# Patient Record
Sex: Male | Born: 2000 | Race: White | Hispanic: No | Marital: Single | Smoking: Former smoker
Health system: Southern US, Community
[De-identification: ages and names within clinical notes are randomized; demographics above are authoritative.]

## PROBLEM LIST (undated history)

## (undated) DIAGNOSIS — Z23 Encounter for immunization: Secondary | ICD-10-CM

## (undated) DIAGNOSIS — L03119 Cellulitis of unspecified part of limb: Secondary | ICD-10-CM

## (undated) DIAGNOSIS — I2699 Other pulmonary embolism without acute cor pulmonale: Secondary | ICD-10-CM

## (undated) DIAGNOSIS — S069XAA Unspecified intracranial injury with loss of consciousness status unknown, initial encounter: Secondary | ICD-10-CM

## (undated) DIAGNOSIS — Z931 Gastrostomy status: Secondary | ICD-10-CM

## (undated) DIAGNOSIS — S069X9A Unspecified intracranial injury with loss of consciousness of unspecified duration, initial encounter: Secondary | ICD-10-CM

## (undated) HISTORY — DX: Other pulmonary embolism without acute cor pulmonale: I26.99

## (undated) HISTORY — DX: Gastrostomy status: Z93.1

## (undated) HISTORY — DX: Unspecified intracranial injury with loss of consciousness of unspecified duration, initial encounter: S06.9X9A

## (undated) HISTORY — DX: Unspecified intracranial injury with loss of consciousness status unknown, initial encounter: S06.9XAA

---
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
## 2018-04-16 HISTORY — PX: OTHER SURGICAL HISTORY: SHX169

## 2018-04-21 DIAGNOSIS — S069XAA Unspecified intracranial injury with loss of consciousness status unknown, initial encounter: Secondary | ICD-10-CM | POA: Insufficient documentation

## 2018-04-21 DIAGNOSIS — Z9081 Acquired absence of spleen: Secondary | ICD-10-CM | POA: Insufficient documentation

## 2018-04-21 DIAGNOSIS — S3210XA Unspecified fracture of sacrum, initial encounter for closed fracture: Secondary | ICD-10-CM | POA: Insufficient documentation

## 2018-04-21 DIAGNOSIS — S12600A Unspecified displaced fracture of seventh cervical vertebra, initial encounter for closed fracture: Secondary | ICD-10-CM | POA: Insufficient documentation

## 2018-04-21 DIAGNOSIS — S42002A Fracture of unspecified part of left clavicle, initial encounter for closed fracture: Secondary | ICD-10-CM | POA: Insufficient documentation

## 2018-04-21 DIAGNOSIS — S32029A Unspecified fracture of second lumbar vertebra, initial encounter for closed fracture: Secondary | ICD-10-CM | POA: Insufficient documentation

## 2018-04-21 DIAGNOSIS — S42001A Fracture of unspecified part of right clavicle, initial encounter for closed fracture: Secondary | ICD-10-CM | POA: Insufficient documentation

## 2018-04-21 DIAGNOSIS — S2241XA Multiple fractures of ribs, right side, initial encounter for closed fracture: Secondary | ICD-10-CM | POA: Insufficient documentation

## 2018-04-21 DIAGNOSIS — S069X9A Unspecified intracranial injury with loss of consciousness of unspecified duration, initial encounter: Secondary | ICD-10-CM | POA: Insufficient documentation

## 2018-05-09 DIAGNOSIS — S062X9A Diffuse traumatic brain injury with loss of consciousness of unspecified duration, initial encounter: Secondary | ICD-10-CM | POA: Insufficient documentation

## 2018-05-09 DIAGNOSIS — S062XAA Diffuse traumatic brain injury with loss of consciousness status unknown, initial encounter: Secondary | ICD-10-CM | POA: Insufficient documentation

## 2018-05-21 DIAGNOSIS — I2699 Other pulmonary embolism without acute cor pulmonale: Secondary | ICD-10-CM

## 2018-05-21 HISTORY — DX: Other pulmonary embolism without acute cor pulmonale: I26.99

## 2018-06-22 DIAGNOSIS — S42125A Nondisplaced fracture of acromial process, left shoulder, initial encounter for closed fracture: Secondary | ICD-10-CM | POA: Insufficient documentation

## 2018-08-14 ENCOUNTER — Encounter: Payer: Self-pay | Admitting: Hematology

## 2018-08-14 LAB — PROTIME-INR

## 2018-08-16 ENCOUNTER — Emergency Department (HOSPITAL_COMMUNITY): Payer: BLUE CROSS/BLUE SHIELD

## 2018-08-16 ENCOUNTER — Other Ambulatory Visit: Payer: Self-pay

## 2018-08-16 ENCOUNTER — Encounter (HOSPITAL_COMMUNITY): Payer: Self-pay | Admitting: Internal Medicine

## 2018-08-16 ENCOUNTER — Inpatient Hospital Stay (HOSPITAL_COMMUNITY): Payer: BLUE CROSS/BLUE SHIELD | Attending: Internal Medicine | Admitting: Internal Medicine

## 2018-08-16 ENCOUNTER — Emergency Department (HOSPITAL_COMMUNITY)
Admission: EM | Admit: 2018-08-16 | Discharge: 2018-08-16 | Disposition: A | Discharge status: Home / Self Care | Payer: BLUE CROSS/BLUE SHIELD | Source: Home / Self Care | Attending: Emergency Medicine | Admitting: Emergency Medicine

## 2018-08-16 ENCOUNTER — Inpatient Hospital Stay (HOSPITAL_COMMUNITY): Payer: BLUE CROSS/BLUE SHIELD

## 2018-08-16 ENCOUNTER — Encounter (HOSPITAL_COMMUNITY): Payer: Self-pay

## 2018-08-16 VITALS — BP 121/85 | HR 80 | Resp 16

## 2018-08-16 DIAGNOSIS — I82722 Chronic embolism and thrombosis of deep veins of left upper extremity: Secondary | ICD-10-CM | POA: Diagnosis present

## 2018-08-16 DIAGNOSIS — D6851 Activated protein C resistance: Secondary | ICD-10-CM | POA: Diagnosis not present

## 2018-08-16 DIAGNOSIS — Z87891 Personal history of nicotine dependence: Secondary | ICD-10-CM | POA: Insufficient documentation

## 2018-08-16 DIAGNOSIS — Z79899 Other long term (current) drug therapy: Secondary | ICD-10-CM | POA: Diagnosis not present

## 2018-08-16 DIAGNOSIS — Z993 Dependence on wheelchair: Secondary | ICD-10-CM

## 2018-08-16 DIAGNOSIS — Z7901 Long term (current) use of anticoagulants: Secondary | ICD-10-CM

## 2018-08-16 DIAGNOSIS — Z931 Gastrostomy status: Secondary | ICD-10-CM

## 2018-08-16 DIAGNOSIS — Z87820 Personal history of traumatic brain injury: Secondary | ICD-10-CM | POA: Diagnosis not present

## 2018-08-16 DIAGNOSIS — Z86711 Personal history of pulmonary embolism: Secondary | ICD-10-CM | POA: Insufficient documentation

## 2018-08-16 DIAGNOSIS — K5900 Constipation, unspecified: Secondary | ICD-10-CM

## 2018-08-16 LAB — COMPREHENSIVE METABOLIC PANEL
ALT: 42 U/L (ref 0–44)
ALT: 36 U/L (ref 0–44)
AST: 17 U/L (ref 15–41)
AST: 16 U/L (ref 15–41)
Albumin: 5 g/dL (ref 3.5–5.0)
Albumin: 4.9 g/dL (ref 3.5–5.0)
Alkaline Phosphatase: 106 U/L (ref 38–126)
Alkaline Phosphatase: 103 U/L (ref 38–126)
Anion gap: 8 (ref 5–15)
Anion gap: 9 (ref 5–15)
BUN: 13 mg/dL (ref 6–20)
BUN: 14 mg/dL (ref 6–20)
CO2: 28 mmol/L (ref 22–32)
CO2: 28 mmol/L (ref 22–32)
Calcium: 10.5 mg/dL — ABNORMAL HIGH (ref 8.9–10.3)
Calcium: 10.1 mg/dL (ref 8.9–10.3)
Chloride: 104 mmol/L (ref 98–111)
Chloride: 104 mmol/L (ref 98–111)
Creatinine, Ser: 0.66 mg/dL (ref 0.61–1.24)
Creatinine, Ser: 0.63 mg/dL (ref 0.61–1.24)
GFR calc Af Amer: >60 mL/min (ref >60)
GFR calc Af Amer: >60 mL/min (ref >60)
GFR calc non Af Amer: >60 mL/min (ref >60)
GFR calc non Af Amer: >60 mL/min (ref >60)
Glucose, Bld: 90 mg/dL (ref 70–99)
Glucose, Bld: 69 mg/dL — ABNORMAL LOW (ref 70–99)
Potassium: 4 mmol/L (ref 3.5–5.1)
Potassium: 3.7 mmol/L (ref 3.5–5.1)
Sodium: 140 mmol/L (ref 135–145)
Sodium: 141 mmol/L (ref 135–145)
Total Bilirubin: 1 mg/dL (ref 0.3–1.2)
Total Bilirubin: 0.8 mg/dL (ref 0.3–1.2)
Total Protein: 8.7 g/dL — ABNORMAL HIGH (ref 6.5–8.1)
Total Protein: 8.3 g/dL — ABNORMAL HIGH (ref 6.5–8.1)

## 2018-08-16 LAB — CBC
HCT: 39.6 % (ref 39.0–52.0)
Hemoglobin: 12.9 g/dL — ABNORMAL LOW (ref 13.0–17.0)
MCH: 29.5 pg (ref 26.0–34.0)
MCHC: 32.6 g/dL (ref 30.0–36.0)
MCV: 90.6 fL (ref 80.0–100.0)
Platelets: 587 10*3/uL — ABNORMAL HIGH (ref 150–400)
RBC: 4.37 MIL/uL (ref 4.22–5.81)
RDW: 15 % (ref 11.5–15.5)
WBC: 8.8 10*3/uL (ref 4.0–10.5)
nRBC: 0 % (ref 0.0–0.2)

## 2018-08-16 LAB — CBC WITH DIFFERENTIAL/PLATELET
Abs Immature Granulocytes: 0.02 10*3/uL (ref 0.00–0.07)
Basophils Absolute: 0 10*3/uL (ref 0.0–0.1)
Basophils Relative: 0 %
Eosinophils Absolute: 0.2 10*3/uL (ref 0.0–0.5)
Eosinophils Relative: 2 %
HCT: 39.5 % (ref 39.0–52.0)
Hemoglobin: 12.7 g/dL — ABNORMAL LOW (ref 13.0–17.0)
Immature Granulocytes: 0 %
Lymphocytes Relative: 24 %
Lymphs Abs: 2.3 10*3/uL (ref 0.7–4.0)
MCH: 29.3 pg (ref 26.0–34.0)
MCHC: 32.2 g/dL (ref 30.0–36.0)
MCV: 91.2 fL (ref 80.0–100.0)
Monocytes Absolute: 0.7 10*3/uL (ref 0.1–1.0)
Monocytes Relative: 7 %
Neutro Abs: 6.4 10*3/uL (ref 1.7–7.7)
Neutrophils Relative %: 67 %
Platelets: 567 10*3/uL — ABNORMAL HIGH (ref 150–400)
RBC: 4.33 MIL/uL (ref 4.22–5.81)
RDW: 15 % (ref 11.5–15.5)
WBC: 9.6 10*3/uL (ref 4.0–10.5)
nRBC: 0 % (ref 0.0–0.2)

## 2018-08-16 LAB — LACTATE DEHYDROGENASE: LDH: 172 U/L (ref 98–192)

## 2018-08-16 LAB — PROTIME-INR
INR: 1.82
Prothrombin Time: 20.9 seconds — ABNORMAL HIGH (ref 11.4–15.2)

## 2018-08-16 LAB — FERRITIN: Ferritin: 98 ng/mL (ref 24–336)

## 2018-08-16 LAB — I-STAT CG4 LACTIC ACID, ED: Lactic Acid, Venous: 1.12 mmol/L (ref 0.5–1.9)

## 2018-08-16 MED ORDER — BISACODYL 10 MG RE SUPP: 10.0000 mg | RECTAL | 0 refills | Status: DC | PRN

## 2018-08-16 MED ORDER — POLYETHYLENE GLYCOL 3350 17 G PO PACK: 17.0000 g | PACK | Freq: Every day | ORAL | 3 refills | Status: DC

## 2018-08-16 NOTE — ED Triage Notes (Addendum)
Pt released from hospital last Tuesday. 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  Mother reports that he is not swallowing as well as he was when he got home from hospital. He reports his mouth hurts and it hurts to swallow . Had thrush in hospital. Mother is giving more through feeding tube to keep hydrated. Also, reports constipation for 6 days,. Last good BM one week ago. Mother reports a decline in energy
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
## 2018-08-16 NOTE — ED Notes (Signed)
ED Provider at bedside. 

## 2018-08-16 NOTE — Discharge Instructions (Addendum)
You were evaluated in the Emergency Department and after careful evaluation, we did not find any emergent condition requiring admission or further testing in the hospital.  Your symptoms today seem to be due to constipation.  MiraLAX can be used up to 6 times a day to help relieve this constipation.  Of also provided some Dulcolax suppositories if the MiraLAX does not seem to be strong enough.  A referral has been placed to the [facility] case management team.  They should be aware of this case and should call you within the next few days to help troubleshoot your home health issues.  If you would like to call this Friday, you can reach our case manager Shanda BumpsJessica at [phone].  Please return to the Emergency Department if you experience any worsening of your condition.  We encourage you to follow up with a primary care provider.  Thank you for allowing us to be a part of your care.

## 2018-08-16 NOTE — ED Provider Notes (Signed)
[facility] Emergency Department Provider Note MRN:  [id number]  Arrival date & time: 08/16/18     Chief Complaint   Dental Pain and Constipation   History of Present Illness   Charles Arroyo is a 17 y.o. year-old male with a [facility], splenectomy presenting to the ED with chief complaint of constipation.  Extensive 371-month [facility], [facility] and [facility].  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  Hospitalization was complicated by stroke as well as pulmonary embolism.  Recently discharged a few days ago.  Mom reporting he has not had a bowel movement in 6 days.  Today complaining of mouth and throat pain.  No recent fevers, no chest pain or shortness of breath, no abdominal pain.  Seems to be eating and drinking less for the past few days.  I was unable to obtain an accurate HPI, PMH, or ROS due to the patient's traumatic brain injury.  Review of Systems  A complete 10 system review of systems was obtained and all systems are negative except as noted in the HPI and PMH.   Patient's Health History    Past Medical History:  Diagnosis Date  . Brain injury (HCC)   . G tube feedings (HCC)   . PE (pulmonary thromboembolism) (HCC) 05/2018    Past Surgical History:  Procedure Laterality Date  . broken clavicles  04/16/2018  . broken jaw  04/16/2018  . broken right heel  04/16/2018    No family history on file.  Social History   Socioeconomic History  . Marital status: Single    Spouse name: Not on file  . Number of children: Not on file  . Years of education: Not on file  . Highest education level: Not on file  Occupational History  . Not on file  Social Needs  . Financial resource strain: Not on file  . Food insecurity:    Worry: Not on file    Inability: Not on file  . Transportation needs:    Medical: Not on file    Non-medical: Not on  file  Tobacco Use  . Smoking status: Former Smoker    Packs/day: 0.50    Years: 5.00    Pack years: 2.50    Types: Cigarettes    Last attempt to quit: 04/16/2018    Years since quitting: 0.3  . Smokeless tobacco: Former Engineer, waterUser  Substance and Sexual Activity  . Alcohol use: No  . Drug use: No  . Sexual activity: Not on file  Lifestyle  . Physical activity:    Days per week: Not on file    Minutes per session: Not on file  . Stress: Not on file  Relationships  . Social connections:    Talks on phone: Not on file    Gets together: Not on file    Attends religious service: Not on file    Active member of club or organization: Not on file    Attends meetings of clubs or organizations: Not on file    Relationship status: Not on file  . Intimate partner violence:    Fear of current or ex partner: Not on file    Emotionally abused: Not on file    Physically abused: Not on file    Forced sexual activity: Not on file  Other Topics Concern  . Not on file  Social History Narrative  . Not on file  Physical Exam  Vital Signs and Nursing Notes reviewed Vitals:   08/16/18 1741  BP: 120/81  Pulse: (!) 108  Resp: 20  Temp: 97.8 F (36.6 C)  SpO2: 99%    CONSTITUTIONAL: Chronically ill-appearing, NAD NEURO:  Alert and oriented x 3, no focal deficits EYES:  eyes equal and reactive ENT/NECK:  no LAD, no JVD; no evidence of thrush, no erythema to the posterior oropharynx CARDIO: Tachycardic rate, well-perfused, normal S1 and S2 PULM:  CTAB no wheezing or rhonchi GI/GU:  normal bowel sounds, non-distended, non-tender MSK/SPINE:  No gross deformities, no edema; contracture of left upper extremity, brace on right lower extremity SKIN:  no rash, atraumatic PSYCH:  Appropriate speech and behavior  Diagnostic and Interventional Summary    Labs Reviewed  CBC - Abnormal; Notable for the following components:      Result Value   Hemoglobin 12.9 (*)    Platelets 587 (*)    All  other components within normal limits  COMPREHENSIVE METABOLIC PANEL - Abnormal; Notable for the following components:   Glucose, Bld 69 (*)    Total Protein 8.3 (*)    All other components within normal limits  PROTIME-INR - Abnormal; Notable for the following components:   Prothrombin Time 20.9 (*)    All other components within normal limits  I-STAT CG4 LACTIC ACID, ED    DG Abd Acute W/Chest  Final Result      Medications - No data to display   Fecal disimpaction Date/Time: 08/16/2018 9:24 PM Performed by: Sabas Sous, MD Authorized by: Sabas Sous, MD  Consent: Verbal consent obtained. Risks and benefits: risks, benefits and alternatives were discussed Consent given by: parent Local anesthesia used: no  Anesthesia: Local anesthesia used: no  Sedation: Patient sedated: no  Patient tolerance: Patient tolerated the procedure well with no immediate complications Comments: Moderate density stool in the rectum, small amount removed and displaced from rectal wall.      Critical Care  ED Course and Medical Decision Making  I have reviewed the triage vital signs and the nursing notes.  Pertinent labs & imaging results that were available during my care of the patient were reviewed by me and considered in my medical decision making (see below for details).  Will screen for dehydration, AKI, metabolic disarray in this 17 year old male with recent extended hospitalization here with decreased p.o. intake.  Will evaluate for fecal impaction with rectal exam and disimpacted necessary given the recent constipation.  Will consult case management/social work given the concern for lack of home health resources.  Labs reassuring, plain film confirming suspicion of constipation, no other acute findings.  Long discussion with family regarding home health needs.  It does sound like there was a lapse in his healthcare needs, [facility] was going to set up home health but it seems to  have fallen through.  Family has tried extensively, has been denied by multiple home health providers.  No indication for admission at this time, referral made to case management to hopefully review the case and help them set up home health.  Family given number to our case managers as well, to call after the holiday.  After the discussed management above, the patient was determined to be safe for discharge.  The patient was in agreement with this plan and all questions regarding their care were answered.  ED return precautions were discussed and the patient will return to the ED with any significant worsening of condition.  Elmer Sow.  Pilar Plate, MD [facility] Emergency Medicine [facility] [email or web address]  Final Clinical Impressions(s) / ED Diagnoses     ICD-10-CM   1. Constipation K59.00 DG Abd Acute W/Chest    DG Abd Acute W/Chest    ED Discharge Orders         Ordered    polyethylene glycol (MIRALAX) packet  Daily     08/16/18 2120    bisacodyl (DULCOLAX) 10 MG suppository  As needed     08/16/18 2120             Sabas Sous, MD 08/16/18 2129

## 2018-08-16 NOTE — ED Notes (Signed)
Patient transported to X-ray 

## 2018-08-17 LAB — ANTINUCLEAR ANTIBODIES, IFA: ANA Ab, IFA: NEGATIVE

## 2018-08-17 LAB — LUPUS ANTICOAGULANT PANEL
DRVVT: 38.1 s (ref 0.0–47.0)
PTT Lupus Anticoagulant: 40.6 s (ref 0.0–51.9)

## 2018-08-17 NOTE — Care Management (Signed)
CM consult received by ED. Per note [facility] and referral made to CM to see if [facility] could [facility]. No HH ordered. CM will f/u on Monday with call to PCP office.

## 2018-08-20 LAB — BETA-2-GLYCOPROTEIN I ABS, IGG/M/A
Beta-2 Glyco I IgG: <9 GPI IgG units (ref 0–20)
Beta-2-Glycoprotein I IgA: 15 GPI IgA units (ref 0–25)
Beta-2-Glycoprotein I IgM: <9 GPI IgM units (ref 0–32)

## 2018-08-21 ENCOUNTER — Other Ambulatory Visit: Payer: Self-pay

## 2018-08-21 ENCOUNTER — Emergency Department (HOSPITAL_COMMUNITY)
Admission: EM | Admit: 2018-08-21 | Discharge: 2018-08-21 | Disposition: A | Discharge status: Home / Self Care | Payer: BLUE CROSS/BLUE SHIELD | Attending: Emergency Medicine | Admitting: Emergency Medicine

## 2018-08-21 ENCOUNTER — Encounter (HOSPITAL_COMMUNITY): Payer: Self-pay | Admitting: Emergency Medicine

## 2018-08-21 ENCOUNTER — Emergency Department (HOSPITAL_COMMUNITY): Payer: BLUE CROSS/BLUE SHIELD

## 2018-08-21 DIAGNOSIS — Z87891 Personal history of nicotine dependence: Secondary | ICD-10-CM | POA: Diagnosis not present

## 2018-08-21 DIAGNOSIS — K5900 Constipation, unspecified: Secondary | ICD-10-CM | POA: Insufficient documentation

## 2018-08-21 LAB — CBC
HCT: 39 % (ref 39.0–52.0)
Hemoglobin: 12.6 g/dL — ABNORMAL LOW (ref 13.0–17.0)
MCH: 29.4 pg (ref 26.0–34.0)
MCHC: 32.3 g/dL (ref 30.0–36.0)
MCV: 91.1 fL (ref 80.0–100.0)
Platelets: 483 10*3/uL — ABNORMAL HIGH (ref 150–400)
RBC: 4.28 MIL/uL (ref 4.22–5.81)
RDW: 15.1 % (ref 11.5–15.5)
WBC: 6.5 10*3/uL (ref 4.0–10.5)
nRBC: 0 % (ref 0.0–0.2)

## 2018-08-21 LAB — PROTIME-INR
INR: 2.17
Prothrombin Time: 23.9 seconds — ABNORMAL HIGH (ref 11.4–15.2)

## 2018-08-21 LAB — COMPREHENSIVE METABOLIC PANEL
ALT: 49 U/L — ABNORMAL HIGH (ref 0–44)
AST: 23 U/L (ref 15–41)
Albumin: 4.6 g/dL (ref 3.5–5.0)
Alkaline Phosphatase: 94 U/L (ref 38–126)
Anion gap: 10 (ref 5–15)
BUN: 12 mg/dL (ref 6–20)
CO2: 29 mmol/L (ref 22–32)
Calcium: 10 mg/dL (ref 8.9–10.3)
Chloride: 100 mmol/L (ref 98–111)
Creatinine, Ser: 0.56 mg/dL — ABNORMAL LOW (ref 0.61–1.24)
GFR calc Af Amer: >60 mL/min (ref >60)
GFR calc non Af Amer: >60 mL/min (ref >60)
Glucose, Bld: 96 mg/dL (ref 70–99)
Potassium: 4.1 mmol/L (ref 3.5–5.1)
Sodium: 139 mmol/L (ref 135–145)
Total Bilirubin: 0.7 mg/dL (ref 0.3–1.2)
Total Protein: 7.9 g/dL (ref 6.5–8.1)

## 2018-08-21 LAB — FACTOR 5 LEIDEN

## 2018-08-21 NOTE — Care Management (Signed)
[facility] has [facility] to determine if referral has previously been made. MD will need to order home health prior to DC from ED and CM will continue to follow.

## 2018-08-21 NOTE — ED Triage Notes (Signed)
Returns to ED, for constipation, Been using miralax as directed, only had one BM since then, Pt is non verbal. Mother states he is in pain, Will not allow them to put his braces on his leg.  Food is coming back out of peg tube. Pt does not have Home health.  Charles Arroyo for a consult.

## 2018-08-21 NOTE — Care Management (Signed)
CM contacted [facility] who says referrals were declined earlier in the year because patient could not be appropriately cared for in the home. CM attempted to contact patient and mother but were unsuccessful. [facility] and notified them about attempt to make referral.
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
## 2018-08-21 NOTE — ED Triage Notes (Signed)
Pt came home with [facility] Nov 18th, Call Thereasa DistanceRodney to consult with family for assistance.

## 2018-08-21 NOTE — Discharge Instructions (Signed)
Please call the [facility] internal medicine clinic to arrange a follow-up with a family doctor at a comprehensive Medical Center.  These administer MiraLAX 4 times a day, if this does not work go to 5 times a day, you may want to try an enema as well and continue to try suppositories.  I would encourage you to seek a consultation with a gastroenterologist, I have given you the phone number for the gastroenterologist on-call at this time.  Return to the emergency department for severe or worsening symptoms

## 2018-08-22 LAB — PROTHROMBIN GENE MUTATION
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
## 2018-08-28 ENCOUNTER — Encounter

## 2018-08-28 ENCOUNTER — Encounter (HOSPITAL_COMMUNITY): Payer: Self-pay | Admitting: Internal Medicine

## 2018-08-28 ENCOUNTER — Other Ambulatory Visit: Payer: Self-pay

## 2018-08-28 ENCOUNTER — Encounter (HOSPITAL_COMMUNITY): Payer: Self-pay

## 2018-08-28 ENCOUNTER — Ambulatory Visit (HOSPITAL_COMMUNITY): Payer: BLUE CROSS/BLUE SHIELD

## 2018-08-28 ENCOUNTER — Inpatient Hospital Stay (HOSPITAL_COMMUNITY): Payer: BLUE CROSS/BLUE SHIELD | Attending: Internal Medicine | Admitting: Internal Medicine

## 2018-08-28 VITALS — BP 115/88 | HR 103 | Resp 18

## 2018-08-28 DIAGNOSIS — Z7901 Long term (current) use of anticoagulants: Secondary | ICD-10-CM | POA: Diagnosis not present

## 2018-08-28 DIAGNOSIS — Z87820 Personal history of traumatic brain injury: Secondary | ICD-10-CM

## 2018-08-28 DIAGNOSIS — Z87891 Personal history of nicotine dependence: Secondary | ICD-10-CM | POA: Diagnosis not present

## 2018-08-28 DIAGNOSIS — Z86711 Personal history of pulmonary embolism: Secondary | ICD-10-CM | POA: Insufficient documentation

## 2018-08-28 DIAGNOSIS — R0602 Shortness of breath: Secondary | ICD-10-CM

## 2018-08-28 DIAGNOSIS — I82722 Chronic embolism and thrombosis of deep veins of left upper extremity: Secondary | ICD-10-CM | POA: Insufficient documentation

## 2018-08-28 DIAGNOSIS — Z931 Gastrostomy status: Secondary | ICD-10-CM | POA: Diagnosis not present

## 2018-08-28 DIAGNOSIS — Z79899 Other long term (current) drug therapy: Secondary | ICD-10-CM

## 2018-08-28 DIAGNOSIS — F17210 Nicotine dependence, cigarettes, uncomplicated: Secondary | ICD-10-CM | POA: Diagnosis not present

## 2018-08-28 DIAGNOSIS — Z720 Tobacco use: Secondary | ICD-10-CM
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
## 2018-09-05 ENCOUNTER — Emergency Department (HOSPITAL_COMMUNITY)
Admission: EM | Admit: 2018-09-05 | Discharge: 2018-09-05 | Disposition: A | Discharge status: Home / Self Care | Payer: BLUE CROSS/BLUE SHIELD | Attending: Emergency Medicine | Admitting: Emergency Medicine

## 2018-09-05 ENCOUNTER — Other Ambulatory Visit: Payer: Self-pay

## 2018-09-05 ENCOUNTER — Encounter (HOSPITAL_COMMUNITY): Payer: Self-pay | Admitting: Emergency Medicine

## 2018-09-05 DIAGNOSIS — Z7901 Long term (current) use of anticoagulants: Secondary | ICD-10-CM | POA: Insufficient documentation

## 2018-09-05 DIAGNOSIS — K5900 Constipation, unspecified: Secondary | ICD-10-CM | POA: Diagnosis not present

## 2018-09-05 DIAGNOSIS — Z7401 Bed confinement status: Secondary | ICD-10-CM | POA: Insufficient documentation

## 2018-09-05 DIAGNOSIS — Z87891 Personal history of nicotine dependence: Secondary | ICD-10-CM | POA: Insufficient documentation

## 2018-09-05 DIAGNOSIS — Z86711 Personal history of pulmonary embolism: Secondary | ICD-10-CM | POA: Insufficient documentation

## 2018-09-05 DIAGNOSIS — Z87820 Personal history of traumatic brain injury: Secondary | ICD-10-CM | POA: Diagnosis not present

## 2018-09-05 DIAGNOSIS — Z79899 Other long term (current) drug therapy: Secondary | ICD-10-CM | POA: Diagnosis not present
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
## 2018-09-05 NOTE — Discharge Instructions (Addendum)
Increase MiraLAX to 2 scoopful's a day with 40 ounces of fluid and increase to 2 capfuls a day until effect to max of 8 capfuls at one time.  Follow up with primary care doctor.
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
## 2018-09-07 ENCOUNTER — Encounter (HOSPITAL_COMMUNITY): Payer: Self-pay

## 2018-09-07 ENCOUNTER — Other Ambulatory Visit: Payer: Self-pay

## 2018-09-07 ENCOUNTER — Emergency Department (HOSPITAL_COMMUNITY)
Admission: EM | Admit: 2018-09-07 | Discharge: 2018-09-08 | Disposition: A | Discharge status: Home / Self Care | Payer: BLUE CROSS/BLUE SHIELD | Attending: Emergency Medicine | Admitting: Emergency Medicine

## 2018-09-07 DIAGNOSIS — Z79899 Other long term (current) drug therapy: Secondary | ICD-10-CM | POA: Insufficient documentation

## 2018-09-07 DIAGNOSIS — Y939 Activity, unspecified: Secondary | ICD-10-CM | POA: Diagnosis not present

## 2018-09-07 DIAGNOSIS — Z931 Gastrostomy status: Secondary | ICD-10-CM | POA: Diagnosis not present

## 2018-09-07 DIAGNOSIS — Z7901 Long term (current) use of anticoagulants: Secondary | ICD-10-CM

## 2018-09-07 DIAGNOSIS — Z87891 Personal history of nicotine dependence: Secondary | ICD-10-CM | POA: Diagnosis not present

## 2018-09-07 DIAGNOSIS — M7051 Other bursitis of knee, right knee: Secondary | ICD-10-CM | POA: Insufficient documentation

## 2018-09-07 DIAGNOSIS — Z86718 Personal history of other venous thrombosis and embolism: Secondary | ICD-10-CM | POA: Diagnosis not present

## 2018-09-07 DIAGNOSIS — R2241 Localized swelling, mass and lump, right lower limb: Secondary | ICD-10-CM | POA: Diagnosis present

## 2018-09-07 LAB — BASIC METABOLIC PANEL
Anion gap: 12 (ref 5–15)
BUN: 10 mg/dL (ref 6–20)
CO2: 27 mmol/L (ref 22–32)
Calcium: 9.8 mg/dL (ref 8.9–10.3)
Chloride: 101 mmol/L (ref 98–111)
Creatinine, Ser: 0.64 mg/dL (ref 0.61–1.24)
GFR calc Af Amer: >60 mL/min (ref >60)
GFR calc non Af Amer: >60 mL/min (ref >60)
Glucose, Bld: 84 mg/dL (ref 70–99)
Potassium: 3.9 mmol/L (ref 3.5–5.1)
Sodium: 140 mmol/L (ref 135–145)

## 2018-09-07 LAB — CBC
HCT: 41 % (ref 39.0–52.0)
Hemoglobin: 13.6 g/dL (ref 13.0–17.0)
MCH: 29.6 pg (ref 26.0–34.0)
MCHC: 33.2 g/dL (ref 30.0–36.0)
MCV: 89.3 fL (ref 80.0–100.0)
Platelets: 439 10*3/uL — ABNORMAL HIGH (ref 150–400)
RBC: 4.59 MIL/uL (ref 4.22–5.81)
RDW: 14.3 % (ref 11.5–15.5)
WBC: 7.9 10*3/uL (ref 4.0–10.5)
nRBC: 0 % (ref 0.0–0.2)

## 2018-09-07 MED ORDER — PREDNISONE 20 MG PO TABS
60.0000 mg | ORAL_TABLET | Freq: Once | ORAL | Status: AC
  Administered 2018-09-07: 60 mg via ORAL
  Filled 2018-09-07: qty 3

## 2018-09-07 MED ORDER — PREDNISONE 50 MG PO TABS: 50.0000 mg | ORAL_TABLET | Freq: Every day | ORAL | 0 refills | Status: DC
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
## 2018-09-07 NOTE — Discharge Instructions (Addendum)
Apply ice for 20-30 minutes, 3-4 times a day.  Return if swelling is getting worse, or if he starts running a fever.

## 2018-11-08 DIAGNOSIS — Z87820 Personal history of traumatic brain injury: Secondary | ICD-10-CM | POA: Insufficient documentation

## 2018-11-13 ENCOUNTER — Ambulatory Visit (HOSPITAL_COMMUNITY): Admission: RE | Admit: 2018-11-13 | Payer: BLUE CROSS/BLUE SHIELD | Source: Ambulatory Visit

## 2018-11-13 ENCOUNTER — Ambulatory Visit (HOSPITAL_COMMUNITY)
Admission: RE | Admit: 2018-11-13 | Discharge: 2018-11-13 | Disposition: A | Discharge status: Home / Self Care | Payer: BLUE CROSS/BLUE SHIELD | Source: Ambulatory Visit | Attending: Internal Medicine | Admitting: Internal Medicine

## 2018-11-13 ENCOUNTER — Inpatient Hospital Stay (HOSPITAL_COMMUNITY): Payer: BLUE CROSS/BLUE SHIELD | Attending: Hematology

## 2018-11-13 DIAGNOSIS — I82722 Chronic embolism and thrombosis of deep veins of left upper extremity: Secondary | ICD-10-CM

## 2018-11-13 DIAGNOSIS — F17210 Nicotine dependence, cigarettes, uncomplicated: Secondary | ICD-10-CM | POA: Diagnosis not present

## 2018-11-13 DIAGNOSIS — R0602 Shortness of breath: Secondary | ICD-10-CM

## 2018-11-13 DIAGNOSIS — Z720 Tobacco use: Secondary | ICD-10-CM

## 2018-11-13 LAB — CBC WITH DIFFERENTIAL/PLATELET
Abs Immature Granulocytes: 0.02 10*3/uL (ref 0.00–0.07)
Basophils Absolute: 0 10*3/uL (ref 0.0–0.1)
Basophils Relative: 0 %
Eosinophils Absolute: 0.2 10*3/uL (ref 0.0–0.5)
Eosinophils Relative: 2 %
HCT: 44.5 % (ref 39.0–52.0)
Hemoglobin: 14 g/dL (ref 13.0–17.0)
Immature Granulocytes: 0 %
Lymphocytes Relative: 26 %
Lymphs Abs: 2 10*3/uL (ref 0.7–4.0)
MCH: 30.4 pg (ref 26.0–34.0)
MCHC: 31.5 g/dL (ref 30.0–36.0)
MCV: 96.5 fL (ref 80.0–100.0)
Monocytes Absolute: 0.8 10*3/uL (ref 0.1–1.0)
Monocytes Relative: 10 %
Neutro Abs: 4.8 10*3/uL (ref 1.7–7.7)
Neutrophils Relative %: 62 %
Platelets: 449 10*3/uL — ABNORMAL HIGH (ref 150–400)
RBC: 4.61 MIL/uL (ref 4.22–5.81)
RDW: 13.8 % (ref 11.5–15.5)
WBC: 7.8 10*3/uL (ref 4.0–10.5)
nRBC: 0 % (ref 0.0–0.2)

## 2018-11-13 LAB — COMPREHENSIVE METABOLIC PANEL
ALT: 22 U/L (ref 0–44)
AST: 16 U/L (ref 15–41)
Albumin: 4.7 g/dL (ref 3.5–5.0)
Alkaline Phosphatase: 85 U/L (ref 38–126)
Anion gap: 8 (ref 5–15)
BUN: 14 mg/dL (ref 6–20)
CO2: 29 mmol/L (ref 22–32)
Calcium: 10 mg/dL (ref 8.9–10.3)
Chloride: 103 mmol/L (ref 98–111)
Creatinine, Ser: 0.57 mg/dL — ABNORMAL LOW (ref 0.61–1.24)
GFR calc Af Amer: >60 mL/min (ref >60)
GFR calc non Af Amer: >60 mL/min (ref >60)
Glucose, Bld: 88 mg/dL (ref 70–99)
Potassium: 4.1 mmol/L (ref 3.5–5.1)
Sodium: 140 mmol/L (ref 135–145)
Total Bilirubin: 0.4 mg/dL (ref 0.3–1.2)
Total Protein: 7.9 g/dL (ref 6.5–8.1)

## 2018-11-13 LAB — PROTIME-INR
INR: 2
Prothrombin Time: 22.2 seconds — ABNORMAL HIGH (ref 11.4–15.2)

## 2018-11-13 LAB — LACTATE DEHYDROGENASE: LDH: 142 U/L (ref 98–192)

## 2018-11-14 ENCOUNTER — Other Ambulatory Visit (HOSPITAL_COMMUNITY): Payer: BLUE CROSS/BLUE SHIELD

## 2018-11-15 LAB — BETA-2-GLYCOPROTEIN I ABS, IGG/M/A
Beta-2 Glyco I IgG: <9 GPI IgG units (ref 0–20)
Beta-2-Glycoprotein I IgA: <9 GPI IgA units (ref 0–25)
Beta-2-Glycoprotein I IgM: <9 GPI IgM units (ref 0–32)

## 2018-11-21 ENCOUNTER — Ambulatory Visit (HOSPITAL_COMMUNITY): Payer: BLUE CROSS/BLUE SHIELD | Admitting: Internal Medicine

## 2018-11-29 ENCOUNTER — Other Ambulatory Visit: Payer: Self-pay

## 2018-11-29 ENCOUNTER — Inpatient Hospital Stay (HOSPITAL_COMMUNITY): Payer: BLUE CROSS/BLUE SHIELD | Attending: Internal Medicine | Admitting: Internal Medicine

## 2018-11-29 ENCOUNTER — Encounter (HOSPITAL_COMMUNITY): Payer: Self-pay | Admitting: Internal Medicine

## 2018-11-29 VITALS — BP 114/74 | HR 78 | Temp 98.5°F | Resp 18

## 2018-11-29 DIAGNOSIS — Z7901 Long term (current) use of anticoagulants: Secondary | ICD-10-CM

## 2018-11-29 DIAGNOSIS — Z79899 Other long term (current) drug therapy: Secondary | ICD-10-CM

## 2018-11-29 DIAGNOSIS — Z87820 Personal history of traumatic brain injury: Secondary | ICD-10-CM | POA: Diagnosis not present

## 2018-11-29 DIAGNOSIS — I82722 Chronic embolism and thrombosis of deep veins of left upper extremity: Secondary | ICD-10-CM

## 2018-11-29 DIAGNOSIS — F17210 Nicotine dependence, cigarettes, uncomplicated: Secondary | ICD-10-CM | POA: Diagnosis not present

## 2018-11-29 DIAGNOSIS — Z993 Dependence on wheelchair: Secondary | ICD-10-CM

## 2018-11-29 DIAGNOSIS — Z931 Gastrostomy status: Secondary | ICD-10-CM | POA: Diagnosis not present
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
## 2018-11-29 NOTE — Patient Instructions (Signed)
Exam and discussion today with Dr. Melton Alar. Return to clinic as scheduled for follow-up.

## 2018-12-12 DIAGNOSIS — S069X9S Unspecified intracranial injury with loss of consciousness of unspecified duration, sequela: Secondary | ICD-10-CM | POA: Insufficient documentation

## 2018-12-12 DIAGNOSIS — F0630 Mood disorder due to known physiological condition, unspecified: Secondary | ICD-10-CM | POA: Insufficient documentation

## 2018-12-12 DIAGNOSIS — S069XAS Unspecified intracranial injury with loss of consciousness status unknown, sequela: Secondary | ICD-10-CM | POA: Insufficient documentation

## 2019-01-16 ENCOUNTER — Encounter: Payer: Self-pay | Admitting: Psychology

## 2019-02-26 ENCOUNTER — Encounter: Payer: Self-pay | Admitting: Student in an Organized Health Care Education/Training Program

## 2019-02-26 NOTE — Progress Notes (Signed)
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 Derek Jack, MD AP-ACAPA None  04/19/2019  9:00 AM Edgardo Roys, PsyD CPR-PRMA CPR    Total duration of non-face-to-face encounter: 19mnutes.  Primary Care Physician[facility] Note by: BGillis Santa MD Date: 02/27/2019; Time: 11:04 AM  Note: This dictation was prepared with Dragon dictation. Any transcriptional errors that may result from this process are unintentional.
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
## 2019-02-27 ENCOUNTER — Encounter: Payer: Self-pay | Admitting: Student in an Organized Health Care Education/Training Program

## 2019-02-27 ENCOUNTER — Other Ambulatory Visit: Payer: Self-pay

## 2019-02-27 ENCOUNTER — Ambulatory Visit
Payer: BLUE CROSS/BLUE SHIELD | Attending: Student in an Organized Health Care Education/Training Program | Admitting: Student in an Organized Health Care Education/Training Program

## 2019-02-27 DIAGNOSIS — G894 Chronic pain syndrome: Secondary | ICD-10-CM | POA: Insufficient documentation

## 2019-02-27 DIAGNOSIS — G890 Central pain syndrome: Secondary | ICD-10-CM | POA: Diagnosis not present

## 2019-02-27 DIAGNOSIS — S2241XS Multiple fractures of ribs, right side, sequela: Secondary | ICD-10-CM

## 2019-02-27 DIAGNOSIS — R252 Cramp and spasm: Secondary | ICD-10-CM

## 2019-02-27 DIAGNOSIS — S069X9S Unspecified intracranial injury with loss of consciousness of unspecified duration, sequela: Secondary | ICD-10-CM

## 2019-02-27 DIAGNOSIS — S062X9S Diffuse traumatic brain injury with loss of consciousness of unspecified duration, sequela: Secondary | ICD-10-CM

## 2019-02-27 DIAGNOSIS — S3210XS Unspecified fracture of sacrum, sequela: Secondary | ICD-10-CM

## 2019-02-27 DIAGNOSIS — S12601S Unspecified nondisplaced fracture of seventh cervical vertebra, sequela: Secondary | ICD-10-CM

## 2019-02-27 DIAGNOSIS — M24522 Contracture, left elbow: Secondary | ICD-10-CM | POA: Insufficient documentation

## 2019-02-27 DIAGNOSIS — S06369S Traumatic hemorrhage of cerebrum, unspecified, with loss of consciousness of unspecified duration, sequela: Secondary | ICD-10-CM

## 2019-02-27 DIAGNOSIS — Z87820 Personal history of traumatic brain injury: Secondary | ICD-10-CM

## 2019-02-27 DIAGNOSIS — S32029S Unspecified fracture of second lumbar vertebra, sequela: Secondary | ICD-10-CM

## 2019-02-27 MED ORDER — BACLOFEN 10 MG PO TABS: 10.0000 mg | ORAL_TABLET | Freq: Three times a day (TID) | ORAL | 2 refills | Status: DC

## 2019-02-27 MED ORDER — PREGABALIN 100 MG PO CAPS: 100.0000 mg | ORAL_CAPSULE | Freq: Two times a day (BID) | ORAL | 2 refills | Status: DC

## 2019-02-28 ENCOUNTER — Other Ambulatory Visit
Admission: RE | Admit: 2019-02-28 | Discharge: 2019-02-28 | Disposition: A | Discharge status: Home / Self Care | Payer: BLUE CROSS/BLUE SHIELD | Source: Ambulatory Visit | Attending: Student in an Organized Health Care Education/Training Program | Admitting: Student in an Organized Health Care Education/Training Program

## 2019-02-28 DIAGNOSIS — G894 Chronic pain syndrome: Secondary | ICD-10-CM | POA: Diagnosis present

## 2019-02-28 LAB — URINE DRUG SCREEN, QUALITATIVE (ARMC ONLY)
Amphetamines, Ur Screen: NOT DETECTED
Barbiturates, Ur Screen: NOT DETECTED
Benzodiazepine, Ur Scrn: NOT DETECTED
Cannabinoid 50 Ng, Ur ~~LOC~~: NOT DETECTED
Cocaine Metabolite,Ur ~~LOC~~: NOT DETECTED
MDMA (Ecstasy)Ur Screen: NOT DETECTED
Methadone Scn, Ur: NOT DETECTED
Opiate, Ur Screen: NOT DETECTED
Phencyclidine (PCP) Ur S: NOT DETECTED
Tricyclic, Ur Screen: NOT DETECTED

## 2019-03-08 ENCOUNTER — Telehealth: Payer: Self-pay | Admitting: *Deleted

## 2019-03-08 NOTE — Telephone Encounter (Signed)
Do you have any further suggestions?

## 2019-03-12 ENCOUNTER — Telehealth: Payer: Self-pay | Admitting: Student in an Organized Health Care Education/Training Program

## 2019-03-12 MED ORDER — CYCLOBENZAPRINE HCL 5 MG PO TABS: 5.0000 mg | ORAL_TABLET | Freq: Three times a day (TID) | ORAL | 0 refills | Status: DC | PRN

## 2019-03-12 NOTE — Telephone Encounter (Signed)
Given that patient is having difficulty managing his pain after transitioning him from tizanidine to baclofen can consider Flexeril 5 mg 3 times daily as needed.  Recommend patient discontinue baclofen, no need to wean off since he has only been on it for a short period of time.  Can try Flexeril.  If this is not effective, can re-prescribe tizanidine.

## 2019-03-12 NOTE — Telephone Encounter (Signed)
Spoke with Case manager Eve and she talked with Charles Arroyo's mom and she reports that since switching muscle relaxer that Charles Arroyo is having increased pain to the point that he is unable to participate in PT and does not have much "quality of life".  The mom also reported that she thought he had been on Baclofen before while in facility and they had to take him off then as well.  Wondering if something different can be prescribed.  I did tell Eve that I would send this message to Dr Holley Raring, he may require another phone visit with the mom but we would see if he can handle this over the phone.

## 2019-03-12 NOTE — Telephone Encounter (Signed)
Eve who is a case Freight forwarder contracted through the pts insurance called and wanted to speak with a nurse about the pts pain management. She stated ever since the muscle relaxer was changed that pts pain has been much worse.

## 2019-03-13 ENCOUNTER — Telehealth: Payer: Self-pay | Admitting: *Deleted

## 2019-03-13 NOTE — Telephone Encounter (Signed)
Voicemail left with Theresa Duty that Rufus has been escribed to [facility] in [place].  Discontinue Baclofen and start Flexeril tid.  If this is not beneficial Dr Holley Raring will rethink represcribing tizanidine.

## 2019-03-13 NOTE — Telephone Encounter (Signed)
Message left informing Eve of Dr. Elwyn Lade recommendation.

## 2019-04-06 ENCOUNTER — Other Ambulatory Visit (HOSPITAL_COMMUNITY): Payer: Self-pay | Admitting: *Deleted

## 2019-04-06 DIAGNOSIS — I82722 Chronic embolism and thrombosis of deep veins of left upper extremity: Secondary | ICD-10-CM

## 2019-04-09 ENCOUNTER — Inpatient Hospital Stay (HOSPITAL_COMMUNITY): Payer: BC Managed Care – PPO | Attending: Hematology

## 2019-04-09 ENCOUNTER — Other Ambulatory Visit: Payer: Self-pay

## 2019-04-09 DIAGNOSIS — Z7901 Long term (current) use of anticoagulants: Secondary | ICD-10-CM | POA: Insufficient documentation

## 2019-04-09 DIAGNOSIS — I82722 Chronic embolism and thrombosis of deep veins of left upper extremity: Secondary | ICD-10-CM | POA: Diagnosis not present

## 2019-04-09 LAB — CBC WITH DIFFERENTIAL/PLATELET
Abs Immature Granulocytes: 0.01 10*3/uL (ref 0.00–0.07)
Basophils Absolute: 0 10*3/uL (ref 0.0–0.1)
Basophils Relative: 0 %
Eosinophils Absolute: 0.2 10*3/uL (ref 0.0–0.5)
Eosinophils Relative: 3 %
HCT: 46.8 % (ref 39.0–52.0)
Hemoglobin: 15.5 g/dL (ref 13.0–17.0)
Immature Granulocytes: 0 %
Lymphocytes Relative: 31 %
Lymphs Abs: 2.2 10*3/uL (ref 0.7–4.0)
MCH: 31.3 pg (ref 26.0–34.0)
MCHC: 33.1 g/dL (ref 30.0–36.0)
MCV: 94.5 fL (ref 80.0–100.0)
Monocytes Absolute: 0.8 10*3/uL (ref 0.1–1.0)
Monocytes Relative: 12 %
Neutro Abs: 3.8 10*3/uL (ref 1.7–7.7)
Neutrophils Relative %: 54 %
Platelets: 405 10*3/uL — ABNORMAL HIGH (ref 150–400)
RBC: 4.95 MIL/uL (ref 4.22–5.81)
RDW: 13.8 % (ref 11.5–15.5)
WBC: 7.1 10*3/uL (ref 4.0–10.5)
nRBC: 0 % (ref 0.0–0.2)

## 2019-04-09 LAB — COMPREHENSIVE METABOLIC PANEL
ALT: 35 U/L (ref 0–44)
AST: 18 U/L (ref 15–41)
Albumin: 4.6 g/dL (ref 3.5–5.0)
Alkaline Phosphatase: 105 U/L (ref 38–126)
Anion gap: 9 (ref 5–15)
BUN: 13 mg/dL (ref 6–20)
CO2: 29 mmol/L (ref 22–32)
Calcium: 10 mg/dL (ref 8.9–10.3)
Chloride: 100 mmol/L (ref 98–111)
Creatinine, Ser: 0.57 mg/dL — ABNORMAL LOW (ref 0.61–1.24)
GFR calc Af Amer: >60 mL/min (ref >60)
GFR calc non Af Amer: >60 mL/min (ref >60)
Glucose, Bld: 84 mg/dL (ref 70–99)
Potassium: 3.9 mmol/L (ref 3.5–5.1)
Sodium: 138 mmol/L (ref 135–145)
Total Bilirubin: 0.8 mg/dL (ref 0.3–1.2)
Total Protein: 7.9 g/dL (ref 6.5–8.1)

## 2019-04-09 LAB — PROTIME-INR
INR: 1.1 (ref 0.8–1.2)
Prothrombin Time: 13.6 seconds (ref 11.4–15.2)

## 2019-04-09 LAB — LACTATE DEHYDROGENASE: LDH: 120 U/L (ref 98–192)

## 2019-04-16 ENCOUNTER — Ambulatory Visit (HOSPITAL_COMMUNITY): Payer: BLUE CROSS/BLUE SHIELD | Admitting: Hematology

## 2019-04-19 ENCOUNTER — Other Ambulatory Visit: Payer: Self-pay | Admitting: Student in an Organized Health Care Education/Training Program

## 2019-04-19 ENCOUNTER — Telehealth: Payer: Self-pay

## 2019-04-19 ENCOUNTER — Other Ambulatory Visit: Payer: Self-pay

## 2019-04-19 ENCOUNTER — Encounter: Payer: Self-pay | Admitting: Psychology

## 2019-04-19 ENCOUNTER — Encounter: Payer: BC Managed Care – PPO | Attending: Psychology | Admitting: Psychology

## 2019-04-19 DIAGNOSIS — F0630 Mood disorder due to known physiological condition, unspecified: Secondary | ICD-10-CM | POA: Diagnosis not present

## 2019-04-19 DIAGNOSIS — S069X9S Unspecified intracranial injury with loss of consciousness of unspecified duration, sequela: Secondary | ICD-10-CM | POA: Insufficient documentation

## 2019-04-19 DIAGNOSIS — Z87820 Personal history of traumatic brain injury: Secondary | ICD-10-CM | POA: Diagnosis not present

## 2019-04-19 DIAGNOSIS — S069X4D Unspecified intracranial injury with loss of consciousness of 6 hours to 24 hours, subsequent encounter: Secondary | ICD-10-CM | POA: Diagnosis not present

## 2019-04-19 MED ORDER — CYCLOBENZAPRINE HCL 10 MG PO TABS: 10.0000 mg | ORAL_TABLET | Freq: Three times a day (TID) | ORAL | 2 refills | Status: DC | PRN

## 2019-04-19 NOTE — Progress Notes (Signed)
Requested Prescriptions   Signed Prescriptions Disp Refills  . cyclobenzaprine (FLEXERIL) 10 MG tablet 90 tablet 2    Sig: Take 1 tablet (10 mg total) by mouth 3 (three) times daily as needed for muscle spasms.

## 2019-04-19 NOTE — Telephone Encounter (Signed)
Spoke with Dr Holley Raring.  He will resend a script for Flexeril 10 mg.  Patients mom notified.

## 2019-04-19 NOTE — Telephone Encounter (Signed)
He is out of muscle relaxers and doesn't have an appt till Tuesday. She wants to know if they can be called in today.
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
## 2019-04-23 ENCOUNTER — Encounter: Payer: Self-pay | Admitting: Student in an Organized Health Care Education/Training Program

## 2019-04-24 ENCOUNTER — Other Ambulatory Visit: Payer: Self-pay

## 2019-04-24 ENCOUNTER — Ambulatory Visit
Payer: BC Managed Care – PPO | Attending: Student in an Organized Health Care Education/Training Program | Admitting: Student in an Organized Health Care Education/Training Program

## 2019-04-24 DIAGNOSIS — S062X9S Diffuse traumatic brain injury with loss of consciousness of unspecified duration, sequela: Secondary | ICD-10-CM

## 2019-04-24 DIAGNOSIS — S069X9S Unspecified intracranial injury with loss of consciousness of unspecified duration, sequela: Secondary | ICD-10-CM

## 2019-04-24 DIAGNOSIS — S2241XS Multiple fractures of ribs, right side, sequela: Secondary | ICD-10-CM

## 2019-04-24 DIAGNOSIS — M24522 Contracture, left elbow: Secondary | ICD-10-CM

## 2019-04-24 DIAGNOSIS — R252 Cramp and spasm: Secondary | ICD-10-CM

## 2019-04-24 DIAGNOSIS — Z87820 Personal history of traumatic brain injury: Secondary | ICD-10-CM

## 2019-04-24 DIAGNOSIS — S06369S Traumatic hemorrhage of cerebrum, unspecified, with loss of consciousness of unspecified duration, sequela: Secondary | ICD-10-CM

## 2019-04-24 DIAGNOSIS — G890 Central pain syndrome: Secondary | ICD-10-CM

## 2019-04-24 DIAGNOSIS — G894 Chronic pain syndrome: Secondary | ICD-10-CM

## 2019-04-24 MED ORDER — CYCLOBENZAPRINE HCL 10 MG PO TABS: 10.0000 mg | ORAL_TABLET | Freq: Three times a day (TID) | ORAL | 3 refills | Status: DC | PRN

## 2019-04-24 MED ORDER — PREGABALIN 100 MG PO CAPS: 100.0000 mg | ORAL_CAPSULE | Freq: Two times a day (BID) | ORAL | 3 refills | Status: DC
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
## 2019-06-18 ENCOUNTER — Encounter: Payer: Self-pay | Admitting: Student in an Organized Health Care Education/Training Program

## 2019-06-18 ENCOUNTER — Ambulatory Visit
Payer: BC Managed Care – PPO | Attending: Student in an Organized Health Care Education/Training Program | Admitting: Student in an Organized Health Care Education/Training Program

## 2019-06-18 ENCOUNTER — Other Ambulatory Visit: Payer: Self-pay

## 2019-06-18 VITALS — BP 120/85 | HR 98 | Temp 97.7°F | Resp 16 | Ht 71.0 in | Wt 132.0 lb

## 2019-06-18 DIAGNOSIS — Z87820 Personal history of traumatic brain injury: Secondary | ICD-10-CM

## 2019-06-18 DIAGNOSIS — G890 Central pain syndrome: Secondary | ICD-10-CM | POA: Diagnosis present

## 2019-06-18 DIAGNOSIS — K5909 Other constipation: Secondary | ICD-10-CM | POA: Diagnosis present

## 2019-06-18 DIAGNOSIS — R252 Cramp and spasm: Secondary | ICD-10-CM | POA: Diagnosis not present

## 2019-06-18 DIAGNOSIS — S062X9S Diffuse traumatic brain injury with loss of consciousness of unspecified duration, sequela: Secondary | ICD-10-CM

## 2019-06-18 DIAGNOSIS — S06369S Traumatic hemorrhage of cerebrum, unspecified, with loss of consciousness of unspecified duration, sequela: Secondary | ICD-10-CM

## 2019-06-18 DIAGNOSIS — G894 Chronic pain syndrome: Secondary | ICD-10-CM | POA: Diagnosis not present

## 2019-06-18 DIAGNOSIS — S069X9S Unspecified intracranial injury with loss of consciousness of unspecified duration, sequela: Secondary | ICD-10-CM

## 2019-06-18 MED ORDER — AMITRIPTYLINE HCL 25 MG PO TABS: ORAL_TABLET | ORAL | 0 refills | Status: DC
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
## 2019-06-18 NOTE — Progress Notes (Signed)
Safety precautions to be maintained throughout the outpatient stay will include: orient to surroundings, keep bed in low position, maintain call bell within reach at all times, provide assistance with transfer out of bed and ambulation.  

## 2019-06-28 ENCOUNTER — Encounter: Payer: Self-pay | Admitting: Psychology

## 2019-06-28 ENCOUNTER — Encounter: Payer: BC Managed Care – PPO | Attending: Psychology | Admitting: Psychology

## 2019-06-28 ENCOUNTER — Other Ambulatory Visit: Payer: Self-pay

## 2019-06-28 DIAGNOSIS — F0630 Mood disorder due to known physiological condition, unspecified: Secondary | ICD-10-CM | POA: Diagnosis not present

## 2019-06-28 DIAGNOSIS — G894 Chronic pain syndrome: Secondary | ICD-10-CM | POA: Diagnosis not present

## 2019-06-28 DIAGNOSIS — S069X4D Unspecified intracranial injury with loss of consciousness of 6 hours to 24 hours, subsequent encounter: Secondary | ICD-10-CM | POA: Diagnosis present

## 2019-06-28 DIAGNOSIS — S069X9S Unspecified intracranial injury with loss of consciousness of unspecified duration, sequela: Secondary | ICD-10-CM | POA: Diagnosis not present
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
## 2019-07-13 ENCOUNTER — Emergency Department
Admission: EM | Admit: 2019-07-13 | Discharge: 2019-07-13 | Disposition: A | Discharge status: Home / Self Care | Payer: BC Managed Care – PPO | Attending: Emergency Medicine | Admitting: Emergency Medicine

## 2019-07-13 ENCOUNTER — Other Ambulatory Visit: Payer: Self-pay

## 2019-07-13 ENCOUNTER — Emergency Department: Payer: BC Managed Care – PPO

## 2019-07-13 ENCOUNTER — Encounter: Payer: Self-pay | Admitting: Emergency Medicine

## 2019-07-13 DIAGNOSIS — Z931 Gastrostomy status: Secondary | ICD-10-CM | POA: Insufficient documentation

## 2019-07-13 DIAGNOSIS — Z87891 Personal history of nicotine dependence: Secondary | ICD-10-CM | POA: Insufficient documentation

## 2019-07-13 DIAGNOSIS — Z7901 Long term (current) use of anticoagulants: Secondary | ICD-10-CM | POA: Insufficient documentation

## 2019-07-13 DIAGNOSIS — Z79899 Other long term (current) drug therapy: Secondary | ICD-10-CM | POA: Insufficient documentation

## 2019-07-13 DIAGNOSIS — B379 Candidiasis, unspecified: Secondary | ICD-10-CM

## 2019-07-13 DIAGNOSIS — R451 Restlessness and agitation: Secondary | ICD-10-CM | POA: Diagnosis not present

## 2019-07-13 DIAGNOSIS — Z86711 Personal history of pulmonary embolism: Secondary | ICD-10-CM | POA: Insufficient documentation

## 2019-07-13 DIAGNOSIS — R4182 Altered mental status, unspecified: Secondary | ICD-10-CM | POA: Diagnosis present

## 2019-07-13 LAB — URINALYSIS, COMPLETE (UACMP) WITH MICROSCOPIC
Bilirubin Urine: NEGATIVE
Glucose, UA: NEGATIVE mg/dL
Hgb urine dipstick: NEGATIVE
Ketones, ur: NEGATIVE mg/dL
Nitrite: NEGATIVE
Protein, ur: NEGATIVE mg/dL
Specific Gravity, Urine: 1.018 (ref 1.005–1.030)
Squamous Epithelial / HPF: NONE SEEN (ref 0–5)
pH: 7 (ref 5.0–8.0)

## 2019-07-13 LAB — COMPREHENSIVE METABOLIC PANEL
ALT: 21 U/L (ref 0–44)
AST: 16 U/L (ref 15–41)
Albumin: 4.4 g/dL (ref 3.5–5.0)
Alkaline Phosphatase: 91 U/L (ref 38–126)
Anion gap: 8 (ref 5–15)
BUN: 15 mg/dL (ref 6–20)
CO2: 28 mmol/L (ref 22–32)
Calcium: 9.6 mg/dL (ref 8.9–10.3)
Chloride: 103 mmol/L (ref 98–111)
Creatinine, Ser: 0.71 mg/dL (ref 0.61–1.24)
GFR calc Af Amer: >60 mL/min (ref >60)
GFR calc non Af Amer: >60 mL/min (ref >60)
Glucose, Bld: 74 mg/dL (ref 70–99)
Potassium: 3.9 mmol/L (ref 3.5–5.1)
Sodium: 139 mmol/L (ref 135–145)
Total Bilirubin: 1 mg/dL (ref 0.3–1.2)
Total Protein: 7.4 g/dL (ref 6.5–8.1)

## 2019-07-13 LAB — CBC WITH DIFFERENTIAL/PLATELET
Abs Immature Granulocytes: 0.01 10*3/uL (ref 0.00–0.07)
Basophils Absolute: 0 10*3/uL (ref 0.0–0.1)
Basophils Relative: 0 %
Eosinophils Absolute: 0.2 10*3/uL (ref 0.0–0.5)
Eosinophils Relative: 2 %
HCT: 40.7 % (ref 39.0–52.0)
Hemoglobin: 14 g/dL (ref 13.0–17.0)
Immature Granulocytes: 0 %
Lymphocytes Relative: 36 %
Lymphs Abs: 3.1 10*3/uL (ref 0.7–4.0)
MCH: 32 pg (ref 26.0–34.0)
MCHC: 34.4 g/dL (ref 30.0–36.0)
MCV: 92.9 fL (ref 80.0–100.0)
Monocytes Absolute: 1.1 10*3/uL — ABNORMAL HIGH (ref 0.1–1.0)
Monocytes Relative: 13 %
Neutro Abs: 4.2 10*3/uL (ref 1.7–7.7)
Neutrophils Relative %: 49 %
Platelets: 465 10*3/uL — ABNORMAL HIGH (ref 150–400)
RBC: 4.38 MIL/uL (ref 4.22–5.81)
RDW: 13.5 % (ref 11.5–15.5)
WBC: 8.6 10*3/uL (ref 4.0–10.5)
nRBC: 0 % (ref 0.0–0.2)

## 2019-07-13 MED ORDER — FLUCONAZOLE 40 MG/ML PO SUSR
150.0000 mg | Freq: Every day | ORAL | Status: DC
  Administered 2019-07-13: 152 mg via ORAL
  Filled 2019-07-13: qty 3.8

## 2019-07-13 MED ORDER — FLUCONAZOLE 40 MG/ML PO SUSR: 150.0000 mg | Freq: Every day | ORAL | 0 refills | Status: AC
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
## 2019-07-13 NOTE — ED Notes (Signed)
Patient transported to CT 

## 2019-07-13 NOTE — ED Notes (Signed)
..  E-signature not working at this time. Pt legal guardian verbalized understanding of D/C instructions, prescriptions and follow up care with no further questions at this time. Pt in NAD and in wheelchair at time of D/C. Legal guardian pushing pt to lobby at this time

## 2019-07-13 NOTE — Discharge Instructions (Signed)
We are adding on a urine culture to his urine to evaluate for UTI.  We've given him a dose of fluconazole and he can take the next dose in 3 days if he continues to have symptoms.  You should discuss with your doctors about adding on a medication to use as needed for when he is acting out.  ReTurn to the ER for any other concerns. CT scan is as below  Diffuse atrophy for age. 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 Patchy small vessel disease noted in the centra semiovale bilaterally. No acute appearing infarct. No mass or hemorrhage.
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
## 2019-07-13 NOTE — ED Notes (Signed)
Legal guardian, mother, at bedside

## 2019-07-15 LAB — URINE CULTURE: Culture: NO GROWTH

## 2019-07-19 ENCOUNTER — Telehealth: Payer: Self-pay | Admitting: *Deleted

## 2019-07-19 NOTE — Telephone Encounter (Signed)
I don't have any additional recommendations.

## 2019-07-19 NOTE — Telephone Encounter (Signed)
Spoke with Nira Conn (patient's mom). Jemmie began taking Amitriptyline as you prescribed in 05-2019. All was ok until his PCP recently added Seroquel back to his list of meds. Since adding the Seroquel, he has had increased agitation, harming himself by hitting his arms and legs against the wall. He stopped the Amitriptylline 2 days ago, and the agitation seems to be better. Any further suggestions, other that stopping the Amitriptylline?

## 2019-07-25 ENCOUNTER — Encounter: Payer: BC Managed Care – PPO | Attending: Psychology | Admitting: Psychology

## 2019-07-25 ENCOUNTER — Other Ambulatory Visit: Payer: Self-pay

## 2019-07-25 ENCOUNTER — Encounter: Payer: Self-pay | Admitting: Psychology

## 2019-07-25 DIAGNOSIS — R4689 Other symptoms and signs involving appearance and behavior: Secondary | ICD-10-CM | POA: Diagnosis present

## 2019-07-25 DIAGNOSIS — S069X9S Unspecified intracranial injury with loss of consciousness of unspecified duration, sequela: Secondary | ICD-10-CM | POA: Insufficient documentation

## 2019-07-25 DIAGNOSIS — G894 Chronic pain syndrome: Secondary | ICD-10-CM | POA: Diagnosis not present

## 2019-07-25 DIAGNOSIS — R252 Cramp and spasm: Secondary | ICD-10-CM

## 2019-07-25 DIAGNOSIS — S069X0S Unspecified intracranial injury without loss of consciousness, sequela: Secondary | ICD-10-CM | POA: Insufficient documentation

## 2019-07-25 DIAGNOSIS — M24522 Contracture, left elbow: Secondary | ICD-10-CM | POA: Insufficient documentation

## 2019-07-25 DIAGNOSIS — F0630 Mood disorder due to known physiological condition, unspecified: Secondary | ICD-10-CM | POA: Diagnosis present

## 2019-07-25 DIAGNOSIS — S069X4D Unspecified intracranial injury with loss of consciousness of 6 hours to 24 hours, subsequent encounter: Secondary | ICD-10-CM | POA: Diagnosis present
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
## 2019-07-26 ENCOUNTER — Ambulatory Visit: Payer: BC Managed Care – PPO | Admitting: Psychology

## 2019-07-26 ENCOUNTER — Encounter: Payer: Self-pay | Admitting: Student in an Organized Health Care Education/Training Program

## 2019-07-26 ENCOUNTER — Encounter (INDEPENDENT_AMBULATORY_CARE_PROVIDER_SITE_OTHER): Payer: Self-pay | Admitting: Nurse Practitioner

## 2019-07-26 ENCOUNTER — Other Ambulatory Visit: Payer: Self-pay

## 2019-07-26 ENCOUNTER — Ambulatory Visit (INDEPENDENT_AMBULATORY_CARE_PROVIDER_SITE_OTHER): Payer: BC Managed Care – PPO | Admitting: Nurse Practitioner

## 2019-07-26 DIAGNOSIS — K5900 Constipation, unspecified: Secondary | ICD-10-CM | POA: Insufficient documentation
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
## 2019-07-26 NOTE — Patient Instructions (Addendum)
1. Miralax 1 capful mixed in 8 ounces of water in the evening.  2. Dulcolax 1 to 2 tabs by mouth  every 3rd night.   3. Call Abigail Teall in 1 week with update

## 2019-07-30 ENCOUNTER — Other Ambulatory Visit: Payer: Self-pay

## 2019-07-30 ENCOUNTER — Ambulatory Visit
Payer: BC Managed Care – PPO | Attending: Student in an Organized Health Care Education/Training Program | Admitting: Student in an Organized Health Care Education/Training Program

## 2019-07-30 ENCOUNTER — Encounter: Payer: Self-pay | Admitting: Student in an Organized Health Care Education/Training Program

## 2019-07-30 DIAGNOSIS — G890 Central pain syndrome: Secondary | ICD-10-CM

## 2019-07-30 DIAGNOSIS — R252 Cramp and spasm: Secondary | ICD-10-CM

## 2019-07-30 DIAGNOSIS — G894 Chronic pain syndrome: Secondary | ICD-10-CM | POA: Diagnosis not present

## 2019-07-30 DIAGNOSIS — S062X9S Diffuse traumatic brain injury with loss of consciousness of unspecified duration, sequela: Secondary | ICD-10-CM

## 2019-07-30 DIAGNOSIS — S069X9S Unspecified intracranial injury with loss of consciousness of unspecified duration, sequela: Secondary | ICD-10-CM

## 2019-07-30 DIAGNOSIS — K5909 Other constipation: Secondary | ICD-10-CM

## 2019-07-30 MED ORDER — PREGABALIN 25 MG PO CAPS: ORAL_CAPSULE | ORAL | 0 refills | Status: DC

## 2019-07-30 NOTE — Progress Notes (Signed)
Pain Management Virtual Encounter Note - Virtual Visit via Video Conference Telehealth (real-time audio visits between healthcare provider and patient).   Patient's Phone No. & Preferred Pharmacy:  [phone] (home); [phone] (mobile); (Preferred) [phone] No e-mail address on record  [facility] - [place], [place] - [address] [place] [address] Phone: [phone] Fax: [phone]  [facility] [address] - [place], [place] - [address] [place] [address]  [place] [id number] Phone: [phone] Fax: [phone]    Pre-screening note:  Our staff contacted Mr. Langston MaskerMorris and offered him an "in person", "face-to-face" appointment versus a telephone encounter. He indicated preferring the telephone encounter, at this time.   Reason for Virtual Visit: COVID-19*  Social distancing based on CDC and AMA recommendations.   I contacted Pati GalloCameron S Hoch on 07/30/2019 via video conference.      I clearly identified myself as Edward JollyBilal Averey Koning, MD. I verified that I was speaking with the correct person using two identifiers (Name: Pati GalloCameron S Hibberd, and date of birth: 09/19/2001).  Advanced Informed Consent I sought verbal advanced consent from Pati Galloameron S Rendall for virtual visit interactions. I informed Mr. Langston MaskerMorris of possible security and privacy concerns, risks, and limitations associated with providing "not-in-person" medical evaluation and management services. I also informed Mr. Langston MaskerMorris of the availability of "in-person" appointments. Finally, I informed him that there would be a charge for the virtual visit and that he could be  personally, fully or partially, financially responsible for it. Mr. Langston MaskerMorris expressed understanding and agreed to proceed.   Historic Elements   Mr. Pati GalloCameron S Surber is a 18 y.o. year old, male patient evaluated today after his last encounter by our practice on 07/19/2019. Mr. Langston MaskerMorris  has a past medical history of Brain injury [facility](HCC), G  tube feedings (HCC), and PE (pulmonary thromboembolism) (HCC) (05/2018). He also  has a past surgical history that includes broken jaw (04/16/2018); broken clavicles (04/16/2018); and broken right heel (04/16/2018). Mr. Langston MaskerMorris has a current medication list which includes the following prescription(s): acetaminophen, bisacodyl, celecoxib, cholecalciferol, cyclobenzaprine, magnesium, OVER THE COUNTER MEDICATION, OVER THE COUNTER MEDICATION, polyethylene glycol, quetiapine, retin-a, rivaroxaban, and pregabalin. He  reports that he quit smoking about 15 months ago. His smoking use included cigarettes. He has a 2.50 pack-year smoking history. He has quit using smokeless tobacco. He reports that he does not drink alcohol or use drugs. Mr. Langston MaskerMorris has No Known Allergies.   HPI  Today, he is being contacted for medication management.  Sheria LangCameron is still continuing to struggle with his chronic pain.  Unfortunately we have tried various pharmacologic's without much benefit.  We have tried gabapentin, tizanidine, baclofen which did not provide significant relief.  He was also prescribed Lyrica was prescribed treated up to 100 mg twice daily.  This did not help with his pain but did cause memory side effects and per his mother they have been weaning down.  He is currently on 50 mg daily.  We had an extensive discussion regarding treatment plan.  I am fairly limited on additional therapeutic options as the patient has tried various membrane stabilizers including gabapentin, Lyrica as well as tricyclic antidepressants such as amitriptyline without any significant improvement in his pain but did result in side effects.  Instructed patient to take 25 mg of Lyrica for another 5 days and then stop.  Follow-up as needed.  Laboratory Chemistry Profile (12 mo)  Renal: 07/13/2019: BUN 15; Creatinine, Ser 0.71  Lab Results  Component Value  Date   GFRAA >60 07/13/2019   GFRNONAA >60 07/13/2019   Hepatic: 07/13/2019:  Albumin 4.4 Lab Results  Component Value Date   AST 16 07/13/2019   ALT 21 07/13/2019   Other: No results found for requested labs within last 8760 hours. Note: Above Lab results reviewed.  Imaging  Last 90 days:  Ct Head Wo Contrast  Result Date: 07/13/2019 CLINICAL DATA:  Altered mental status. Previous traumatic brain injury. EXAM: CT HEAD WITHOUT CONTRAST TECHNIQUE: Contiguous axial images were obtained from the base of the skull through the vertex without intravenous contrast. COMPARISON:  None. FINDINGS: Brain: There is moderate diffuse atrophy for age. There is no intracranial mass, hemorrhage, extra-axial fluid collection, or midline shift. There are prior infarcts in both inferior to mid frontal lobes as well as in the inferior anterior right temporal lobe, stable. There are foci of decreased attenuation in the periventricular white matter consistent with diffuse small vessel disease. No acute infarct is demonstrable on this study. Vascular: There is no hyperdense vessel. No vascular calcifications are evident. Skull: Postoperative bony defect is noted in the right frontal bone. No acute fracture evident. No blastic or lytic bone lesions. Sinuses/Orbits: Visualized paranasal sinuses are clear. Orbits appear symmetric bilaterally. Other: Mastoid air cells are clear. IMPRESSION: Diffuse atrophy for age. 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 Patchy small vessel disease noted in the centra semiovale bilaterally. No acute appearing infarct. No mass or hemorrhage. Electronically Signed   By: Lowella Grip III M.D.   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  Plan of Care  I have discontinued Jessejames Steelman. Faivre's pregabalin. I am also having him start on pregabalin. Additionally, I am having him maintain his acetaminophen, Cholecalciferol, QUEtiapine, rivaroxaban, cyclobenzaprine, celecoxib, OVER THE COUNTER MEDICATION, Magnesium, Retin-A, OVER THE COUNTER MEDICATION, bisacodyl, and polyethylene glycol.  Pharmacotherapy (Medications Ordered): Meds ordered this encounter  Medications  . pregabalin (LYRICA) 25 MG capsule    Sig: 25 mg daily for 5 days then discontinue    Dispense:  5 capsule    Refill:  0   Follow-up plan:   Return if symptoms worsen or fail to improve.    Recent Visits Date Type Provider Dept  06/18/19 Office Visit Gillis Santa, MD [facility]  Showing recent visits within past 90 days and meeting all other requirements   Today's Visits Date Type Provider Dept  07/30/19 Office Visit Gillis Santa, MD [facility]  Showing today's visits and meeting all other requirements   Future Appointments No visits were found meeting these conditions.  Showing future appointments within next 90 days and meeting all other requirements   I discussed the assessment and treatment plan with the patient. The patient was provided an opportunity to ask questions and all were answered. The patient agreed with the plan and demonstrated an understanding of the instructions.  Patient advised to call back or seek an in-person evaluation if the symptoms or condition worsens.  Total duration of non-face-to-face encounter:15 minutes.  Note by: Gillis Santa, MD Date: 07/30/2019; Time: 2:10 PM  Note: This dictation was prepared with Dragon dictation. Any transcriptional errors that may result from this process are unintentional.  Disclaimer:  * Given the special circumstances of the COVID-19 pandemic, the federal government has announced that the Office for Civil Rights (OCR)  will exercise its enforcement discretion and will not impose penalties on physicians using telehealth in the event of noncompliance with regulatory requirements under the [employer] Portability and Accountability Act (HIPAA) in connection with the good faith provision of telehealth during the COVID-19 national public health emergency. (AMA)

## 2019-08-06 ENCOUNTER — Encounter (HOSPITAL_BASED_OUTPATIENT_CLINIC_OR_DEPARTMENT_OTHER): Payer: BC Managed Care – PPO | Admitting: Psychology

## 2019-08-06 ENCOUNTER — Other Ambulatory Visit: Payer: Self-pay

## 2019-08-06 DIAGNOSIS — S069X4D Unspecified intracranial injury with loss of consciousness of 6 hours to 24 hours, subsequent encounter: Secondary | ICD-10-CM | POA: Diagnosis not present

## 2019-08-06 DIAGNOSIS — R252 Cramp and spasm: Secondary | ICD-10-CM

## 2019-08-06 DIAGNOSIS — F0630 Mood disorder due to known physiological condition, unspecified: Secondary | ICD-10-CM | POA: Diagnosis not present

## 2019-08-06 DIAGNOSIS — S069X9S Unspecified intracranial injury with loss of consciousness of unspecified duration, sequela: Secondary | ICD-10-CM | POA: Diagnosis not present

## 2019-08-06 DIAGNOSIS — G894 Chronic pain syndrome: Secondary | ICD-10-CM | POA: Diagnosis not present

## 2019-08-09 ENCOUNTER — Encounter: Payer: Self-pay | Admitting: Psychology
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
## 2019-08-15 ENCOUNTER — Other Ambulatory Visit: Payer: Self-pay

## 2019-08-15 ENCOUNTER — Encounter: Payer: Self-pay | Admitting: Physical Medicine & Rehabilitation

## 2019-08-15 ENCOUNTER — Encounter (HOSPITAL_BASED_OUTPATIENT_CLINIC_OR_DEPARTMENT_OTHER): Payer: BC Managed Care – PPO | Admitting: Physical Medicine & Rehabilitation

## 2019-08-15 VITALS — BP 125/89 | HR 98 | Temp 97.9°F | Ht 71.5 in | Wt 195.0 lb

## 2019-08-15 DIAGNOSIS — M24522 Contracture, left elbow: Secondary | ICD-10-CM

## 2019-08-15 DIAGNOSIS — G890 Central pain syndrome: Secondary | ICD-10-CM

## 2019-08-15 DIAGNOSIS — S069X0S Unspecified intracranial injury without loss of consciousness, sequela: Secondary | ICD-10-CM

## 2019-08-15 DIAGNOSIS — R252 Cramp and spasm: Secondary | ICD-10-CM

## 2019-08-15 DIAGNOSIS — S062X4S Diffuse traumatic brain injury with loss of consciousness of 6 hours to 24 hours, sequela: Secondary | ICD-10-CM

## 2019-08-15 DIAGNOSIS — R4689 Other symptoms and signs involving appearance and behavior: Secondary | ICD-10-CM

## 2019-08-15 MED ORDER — TRAMADOL HCL 50 MG PO TABS: 50.0000 mg | ORAL_TABLET | Freq: Four times a day (QID) | ORAL | 1 refills | Status: DC | PRN

## 2019-08-15 MED ORDER — PROPRANOLOL HCL 10 MG PO TABS: 10.0000 mg | ORAL_TABLET | Freq: Three times a day (TID) | ORAL | 2 refills | Status: DC

## 2019-08-15 NOTE — Patient Instructions (Signed)
PLEASE FEEL FREE TO CALL OUR OFFICE WITH ANY PROBLEMS OR QUESTIONS ([phone])      
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
## 2019-08-15 NOTE — Progress Notes (Signed)
Dr. [name] stated will do UDS on next visit

## 2019-08-22 ENCOUNTER — Telehealth: Payer: Self-pay | Admitting: *Deleted

## 2019-08-22 NOTE — Telephone Encounter (Signed)
Charles Arroyo called and said that the new medication Dr [name] prescribed is not helping, and is asking if there is something else that can be done.  Her number is [phone].  She says she thinks the propanolol may be working but the tramadol is not.  He is in pain and "you can't touch him".  He apparently has a wound in the bend of his arm and they cannot straighten it out to clean and do wound care because of spasms and pain. She said he is off the flexeril. Please advise.

## 2019-08-23 ENCOUNTER — Encounter: Payer: BC Managed Care – PPO | Admitting: Student in an Organized Health Care Education/Training Program

## 2019-08-23 NOTE — Telephone Encounter (Signed)
I gave Mrs Eastham the information about the tramadol.  She will send a mychart message to let us know if it is helping and if so he may need a disp# changed with a new order. I was not clear on the propanolol- about dosing change.  She says his bp has been normal the few times she has checked it, but his pulse rate remains elevated.

## 2019-08-23 NOTE — Telephone Encounter (Signed)
Please have her try 100mg  tramadol at one time and see how he responds to that. If his bp is tolerating, we can increase propranolol as well.

## 2019-08-24 ENCOUNTER — Encounter: Payer: BC Managed Care – PPO | Attending: Psychology | Admitting: Psychology

## 2019-08-24 ENCOUNTER — Other Ambulatory Visit: Payer: Self-pay

## 2019-08-24 DIAGNOSIS — S069XAS Unspecified intracranial injury with loss of consciousness status unknown, sequela: Secondary | ICD-10-CM

## 2019-08-24 DIAGNOSIS — R252 Cramp and spasm: Secondary | ICD-10-CM | POA: Insufficient documentation

## 2019-08-24 DIAGNOSIS — S062X3S Diffuse traumatic brain injury with loss of consciousness of 1 hour to 5 hours 59 minutes, sequela: Secondary | ICD-10-CM | POA: Insufficient documentation

## 2019-08-24 DIAGNOSIS — S069X9S Unspecified intracranial injury with loss of consciousness of unspecified duration, sequela: Secondary | ICD-10-CM | POA: Diagnosis not present

## 2019-08-24 DIAGNOSIS — M24522 Contracture, left elbow: Secondary | ICD-10-CM | POA: Insufficient documentation

## 2019-08-24 DIAGNOSIS — G890 Central pain syndrome: Secondary | ICD-10-CM | POA: Diagnosis not present

## 2019-08-24 DIAGNOSIS — G894 Chronic pain syndrome: Secondary | ICD-10-CM | POA: Insufficient documentation

## 2019-08-24 DIAGNOSIS — S062X4S Diffuse traumatic brain injury with loss of consciousness of 6 hours to 24 hours, sequela: Secondary | ICD-10-CM | POA: Diagnosis not present

## 2019-08-24 DIAGNOSIS — F0630 Mood disorder due to known physiological condition, unspecified: Secondary | ICD-10-CM | POA: Diagnosis not present

## 2019-08-24 DIAGNOSIS — S069X4D Unspecified intracranial injury with loss of consciousness of 6 hours to 24 hours, subsequent encounter: Secondary | ICD-10-CM | POA: Insufficient documentation

## 2019-08-24 DIAGNOSIS — R4689 Other symptoms and signs involving appearance and behavior: Secondary | ICD-10-CM | POA: Insufficient documentation

## 2019-08-24 DIAGNOSIS — S069X0S Unspecified intracranial injury without loss of consciousness, sequela: Secondary | ICD-10-CM | POA: Insufficient documentation

## 2019-08-24 MED ORDER — PROPRANOLOL HCL 20 MG PO TABS: 20.0000 mg | ORAL_TABLET | Freq: Three times a day (TID) | ORAL | 3 refills | Status: DC

## 2019-08-24 NOTE — Telephone Encounter (Signed)
OK.   I didn't give a dose change on propranolol. Wanted to see if bp's were robust enough to increase dose. Since it seems as if they are, today, let's go ahead and increase 20mg  TID today.  Rx sent in to pharmacy.    Tried to call mom twice, but no answer.

## 2019-08-24 NOTE — Telephone Encounter (Signed)
Mrs Charles Arroyo called back and I have talked with her about the propanolol. She has some specific questions about Nyles having anger issues today which is new. I have sent a message to Dr [name] to call her and told her to please keep her phone with her to answer when he is able to call.

## 2019-08-24 NOTE — Telephone Encounter (Signed)
Left message for Charles Arroyo to call me back to discuss propanolol. If she has spoken with DR [name] she can disregard message.
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
## 2019-08-29 NOTE — Telephone Encounter (Signed)
Contacted [name] re: meds

## 2019-08-29 NOTE — Telephone Encounter (Addendum)
Recieved similar message from [place] [facility] stating the same thing in the message.

## 2019-09-10 ENCOUNTER — Telehealth: Payer: Self-pay | Admitting: *Deleted

## 2019-09-10 ENCOUNTER — Other Ambulatory Visit: Payer: Self-pay | Admitting: Physical Medicine & Rehabilitation

## 2019-09-10 DIAGNOSIS — M24522 Contracture, left elbow: Secondary | ICD-10-CM

## 2019-09-10 NOTE — Telephone Encounter (Signed)
Charles Arroyo does not have enough tramadol since the order was changed. He was increased to taking 2 tablets bid on 08/22/19 by vo DR Charles Arroyo in a phone message. No change was made to the Rx at that time in Epic. In looking at PMP his disp on 08/15/19 was #56 and on 09/03/19 he got #28 and it wasn't clear why he was not receiving the full #60 tablets.  I spoke with the pharmacist about this discrepancy. He explained that it was related to insurance only allowing 7 days but they paid cash and he said he still had #40 Charles Arroyo could get.  His mother will try and pick those up which they should have enough to get to the 12/30 appointment with Dr Charles Arroyo.  If not she will call back. She is aware that Dr Charles Arroyo is out of the office this week and I cannot make changes to his rx until he is back on 09/17/19.

## 2019-09-15 ENCOUNTER — Other Ambulatory Visit: Payer: Self-pay

## 2019-09-15 ENCOUNTER — Emergency Department
Admission: EM | Admit: 2019-09-15 | Discharge: 2019-09-15 | Disposition: A | Discharge status: Home / Self Care | Payer: BC Managed Care – PPO | Attending: Emergency Medicine | Admitting: Emergency Medicine

## 2019-09-15 ENCOUNTER — Emergency Department: Payer: BC Managed Care – PPO

## 2019-09-15 DIAGNOSIS — M545 Low back pain, unspecified: Secondary | ICD-10-CM

## 2019-09-15 DIAGNOSIS — Z7901 Long term (current) use of anticoagulants: Secondary | ICD-10-CM | POA: Diagnosis not present

## 2019-09-15 DIAGNOSIS — R319 Hematuria, unspecified: Secondary | ICD-10-CM | POA: Insufficient documentation

## 2019-09-15 DIAGNOSIS — Z86711 Personal history of pulmonary embolism: Secondary | ICD-10-CM | POA: Insufficient documentation

## 2019-09-15 DIAGNOSIS — Z87891 Personal history of nicotine dependence: Secondary | ICD-10-CM | POA: Diagnosis not present

## 2019-09-15 DIAGNOSIS — Z931 Gastrostomy status: Secondary | ICD-10-CM | POA: Diagnosis not present

## 2019-09-15 DIAGNOSIS — Z87820 Personal history of traumatic brain injury: Secondary | ICD-10-CM | POA: Diagnosis not present

## 2019-09-15 DIAGNOSIS — Z79899 Other long term (current) drug therapy: Secondary | ICD-10-CM | POA: Diagnosis not present

## 2019-09-15 DIAGNOSIS — M549 Dorsalgia, unspecified: Secondary | ICD-10-CM | POA: Diagnosis present

## 2019-09-15 LAB — URINALYSIS, ROUTINE W REFLEX MICROSCOPIC
Bilirubin Urine: NEGATIVE
Glucose, UA: NEGATIVE mg/dL
Hgb urine dipstick: NEGATIVE
Ketones, ur: NEGATIVE mg/dL
Leukocytes,Ua: NEGATIVE
Nitrite: NEGATIVE
Protein, ur: NEGATIVE mg/dL
Specific Gravity, Urine: 1.009 (ref 1.005–1.030)
pH: 6 (ref 5.0–8.0)

## 2019-09-15 LAB — COMPREHENSIVE METABOLIC PANEL
ALT: 23 U/L (ref 0–44)
AST: 18 U/L (ref 15–41)
Albumin: 4.8 g/dL (ref 3.5–5.0)
Alkaline Phosphatase: 81 U/L (ref 38–126)
Anion gap: 13 (ref 5–15)
BUN: 10 mg/dL (ref 6–20)
CO2: 25 mmol/L (ref 22–32)
Calcium: 9.5 mg/dL (ref 8.9–10.3)
Chloride: 99 mmol/L (ref 98–111)
Creatinine, Ser: 0.76 mg/dL (ref 0.61–1.24)
GFR calc Af Amer: >60 mL/min (ref >60)
GFR calc non Af Amer: >60 mL/min (ref >60)
Glucose, Bld: 104 mg/dL — ABNORMAL HIGH (ref 70–99)
Potassium: 4.6 mmol/L (ref 3.5–5.1)
Sodium: 137 mmol/L (ref 135–145)
Total Bilirubin: 1 mg/dL (ref 0.3–1.2)
Total Protein: 7.8 g/dL (ref 6.5–8.1)

## 2019-09-15 LAB — CBC WITH DIFFERENTIAL/PLATELET
Abs Immature Granulocytes: 0.01 10*3/uL (ref 0.00–0.07)
Basophils Absolute: 0 10*3/uL (ref 0.0–0.1)
Basophils Relative: 0 %
Eosinophils Absolute: 0.1 10*3/uL (ref 0.0–0.5)
Eosinophils Relative: 2 %
HCT: 43.7 % (ref 39.0–52.0)
Hemoglobin: 15.3 g/dL (ref 13.0–17.0)
Immature Granulocytes: 0 %
Lymphocytes Relative: 43 %
Lymphs Abs: 2.6 10*3/uL (ref 0.7–4.0)
MCH: 31.7 pg (ref 26.0–34.0)
MCHC: 35 g/dL (ref 30.0–36.0)
MCV: 90.5 fL (ref 80.0–100.0)
Monocytes Absolute: 0.7 10*3/uL (ref 0.1–1.0)
Monocytes Relative: 12 %
Neutro Abs: 2.6 10*3/uL (ref 1.7–7.7)
Neutrophils Relative %: 43 %
Platelets: 458 10*3/uL — ABNORMAL HIGH (ref 150–400)
RBC: 4.83 MIL/uL (ref 4.22–5.81)
RDW: 12.3 % (ref 11.5–15.5)
WBC: 6.1 10*3/uL (ref 4.0–10.5)
nRBC: 0 % (ref 0.0–0.2)

## 2019-09-15 LAB — LIPASE, BLOOD: Lipase: 22 U/L (ref 11–51)

## 2019-09-15 NOTE — Discharge Instructions (Signed)
Your CT scan was reassuring except for showing a 6 mm stone inside the kidney.  Your urine did not have any evidence of UTI.  We did send it for culture.  You to follow-up with your primary doctor but I suspect that the blood was secondary to the recently treated UTI or from a small kidney stone.  Return to the ER for fevers or worsening symptoms or any other concerns

## 2019-09-15 NOTE — ED Notes (Signed)
Pt wet brief changed

## 2019-09-15 NOTE — ED Notes (Signed)
Mother at bedside st husband is on the way.

## 2019-09-15 NOTE — ED Notes (Addendum)
Mom at bedside and speaking with Dr Jari Pigg
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
## 2019-09-15 NOTE — ED Notes (Addendum)
Dr. Funke at bedside. 
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
## 2019-09-15 NOTE — ED Notes (Signed)
Pt transported to CT ?

## 2019-09-15 NOTE — ED Notes (Signed)
Legal guardian mother siting at bedside at this time. Mother st no transportation is available at this time.

## 2019-09-15 NOTE — ED Notes (Signed)
Mother who pt's legal guardian st pt's Lucianne Lei is not working a this time and she needs to go home and have family help her in order to have a ride home.

## 2019-09-15 NOTE — ED Notes (Signed)
This Rn spoke with mother regarding DC and transportation. Mother st "Lucianne Lei we use at home doesn't work". St she is going to look for a ride home at this time.

## 2019-09-17 LAB — URINE CULTURE: Culture: NO GROWTH

## 2019-09-18 ENCOUNTER — Other Ambulatory Visit: Payer: Self-pay | Admitting: Physical Medicine & Rehabilitation

## 2019-09-18 DIAGNOSIS — M24522 Contracture, left elbow: Secondary | ICD-10-CM

## 2019-09-19 ENCOUNTER — Other Ambulatory Visit: Payer: Self-pay

## 2019-09-19 ENCOUNTER — Encounter (HOSPITAL_BASED_OUTPATIENT_CLINIC_OR_DEPARTMENT_OTHER): Payer: BC Managed Care – PPO | Admitting: Physical Medicine & Rehabilitation

## 2019-09-19 ENCOUNTER — Encounter: Payer: Self-pay | Admitting: Physical Medicine & Rehabilitation

## 2019-09-19 VITALS — BP 151/107 | HR 81

## 2019-09-19 DIAGNOSIS — S062X3S Diffuse traumatic brain injury with loss of consciousness of 1 hour to 5 hours 59 minutes, sequela: Secondary | ICD-10-CM

## 2019-09-19 DIAGNOSIS — G894 Chronic pain syndrome: Secondary | ICD-10-CM | POA: Diagnosis not present

## 2019-09-19 DIAGNOSIS — R252 Cramp and spasm: Secondary | ICD-10-CM

## 2019-09-19 DIAGNOSIS — F0630 Mood disorder due to known physiological condition, unspecified: Secondary | ICD-10-CM

## 2019-09-19 DIAGNOSIS — S069X9S Unspecified intracranial injury with loss of consciousness of unspecified duration, sequela: Secondary | ICD-10-CM

## 2019-09-19 DIAGNOSIS — S069XAS Unspecified intracranial injury with loss of consciousness status unknown, sequela: Secondary | ICD-10-CM

## 2019-09-19 MED ORDER — TRAMADOL HCL 50 MG PO TABS: 50.0000 mg | ORAL_TABLET | Freq: Four times a day (QID) | ORAL | 1 refills | Status: DC | PRN

## 2019-09-19 MED ORDER — BACLOFEN 20 MG PO TABS: 20.0000 mg | ORAL_TABLET | Freq: Four times a day (QID) | ORAL | 4 refills | Status: DC

## 2019-09-19 MED ORDER — METHYLPHENIDATE HCL 5 MG PO TABS: 5.0000 mg | ORAL_TABLET | Freq: Two times a day (BID) | ORAL | 0 refills | Status: DC

## 2019-09-19 MED ORDER — PROPRANOLOL HCL 40 MG PO TABS: 20.0000 mg | ORAL_TABLET | Freq: Three times a day (TID) | ORAL | 4 refills | Status: DC

## 2019-09-19 NOTE — Progress Notes (Signed)
Subjective:    Patient ID: Charles Arroyo, male    DOB: 08/14/2001, 18 y.o.   MRN: [id number]  HPI  This is a follow-up visit for Mr. Pricilla HandlerMorris Arroyo.  I first saw him on November 25.  Main focus of last visit was assessment of patient's pain as well as behavioral issues.  Patient with ongoing significant deficits related to his spasms/contractures as well as cognitive sequela from his traumatic brain injury in July 2019.  I initiated the trial of tramadol 50 mg every 6 hours as needed for pain control as well as propranolol for mood and restlessness.  Tramadol seems to help in the morning and at nighttime but there are gaps in the day where he has pain that its not covered as well.  He is sleeping fairly well with the Seroquel.  There have been no issues with the propranolol.  His blood pressure remains elevated at times especially when he is agitated.  Mom has noticed a rash intermittently on his arm and other areas including his neck when he is really worked up.  The rash was actually evident today in the room.   He was in the ED on 12/26 with hematuria and kidney stones which he since has passed apparently.    He saw his neurologist who recommended some changes.  I discussed these changes with his mother and we proceeded with the baclofen as was recommended.  She felt this helped initially at the 10 and 15 mg doses.  She feels that since then that the medication "has worn off.  When his spasticity was better he did seem to be more calm.  Mom does state that he is taking his medications better and is to be able to take pills.   Pain Inventory Average Pain 10 Pain Right Now 10 My pain is sharp, burning, dull, stabbing, tingling and aching  In the last 24 hours, has pain interfered with the following? General activity 10 Relation with others 10 Enjoyment of life 10 What TIME of day is your pain at its worst? night Sleep (in general) Good  Pain is worse with: sitting, standing and  unsure Pain improves with: therapy/exercise and medication Relief from Meds: 2  Mobility ability to climb steps?  no do you drive?  no use a wheelchair needs help with transfers  Function disabled: date disabled . I need assistance with the following:  dressing, bathing, toileting, meal prep, household duties and shopping  Neuro/Psych bladder control problems bowel control problems weakness numbness tremor tingling trouble walking spasms confusion anxiety  Prior Studies Any changes since last visit?  no  Physicians involved in your care Any changes since last visit?  no   History reviewed. No pertinent family history. Social History   Socioeconomic History  . Marital status: Single    Spouse name: Not on file  . Number of children: Not on file  . Years of education: Not on file  . Highest education level: Not on file  Occupational History  . Not on file  Tobacco Use  . Smoking status: Former Smoker    Packs/day: 0.50    Years: 5.00    Pack years: 2.50    Types: Cigarettes    Quit date: 04/16/2018    Years since quitting: 1.4  . Smokeless tobacco: Former Engineer, waterUser  Substance and Sexual Activity  . Alcohol use: No  . Drug use: No  . Sexual activity: Not on file  Other Topics Concern  . Not  on file  Social History Narrative  . Not on file   Social Determinants of Health   Financial Resource Strain:   . Difficulty of Paying Living Expenses: Not on file  Food Insecurity:   . Worried About Charity fundraiser in the Last Year: Not on file  . Ran Out of Food in the Last Year: Not on file  Transportation Needs:   . Lack of Transportation (Medical): Not on file  . Lack of Transportation (Non-Medical): Not on file  Physical Activity:   . Days of Exercise per Week: Not on file  . Minutes of Exercise per Session: Not on file  Stress:   . Feeling of Stress : Not on file  Social Connections:   . Frequency of Communication with Friends and Family: Not on file   . Frequency of Social Gatherings with Friends and Family: Not on file  . Attends Religious Services: Not on file  . Active Member of Clubs or Organizations: Not on file  . Attends Archivist Meetings: Not on file  . Marital Status: Not on file   Past Surgical History:  Procedure Laterality Date  . broken clavicles  04/16/2018  . broken jaw  04/16/2018  . broken right heel  04/16/2018   Past Medical History:  Diagnosis Date  . Brain injury ([place])   . G tube feedings ([place])   . PE (pulmonary thromboembolism) ([place]) 05/2018   BP (!) 151/107   Pulse 81   SpO2 97%   Opioid Risk Score:   Fall Risk Score:  `1  Depression screen PHQ 2/9  No flowsheet data found.   Review of Systems  Constitutional: Negative.   HENT: Negative.   Eyes: Negative.   Respiratory: Negative.   Cardiovascular: Negative.   Gastrointestinal: Negative.        Neurogenic   Endocrine: Negative.   Genitourinary: Negative.        Neurogenic  Musculoskeletal: Positive for gait problem and neck pain.  Allergic/Immunologic: Negative.   Neurological: Positive for tremors, weakness and numbness.       Tingling  Psychiatric/Behavioral: Positive for agitation, behavioral problems and confusion. The patient is nervous/anxious.        Objective:   Physical Exam General: No acute distress HEENT: EOMI, oral membranes moist Cards: reg rate  Chest: normal effort Abdomen: Soft, NT, ND Skin: dry, intact, patch of erythema, macular rash on right arm--non tender Extremities: no edema   Neuro: remains severely distracted. Easily agitated. Left arm held in flexion at the elbow, wrist contracted and supinated. Moves left leg spontaneously. Writhing, falling out of chair in room.  Musculoskeletal: left leg and low back tender with minimal attempts at ROM. Left wrist contracted.   Psych: restless and agitated       Assessment & Plan:  1.  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 3.  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 4.  Chronic pain syndrome related to the above.  This seems to be primarily related to his spasticity but there may be a neurogenic component to this.  Also, from a behavioral standpoint he perseverates on pain and has poor impulse control as related to the pain 5.  Insomnia related to the above 6.  History of DVT on Xarelto   Plan: 1.  Pt is  tolerating tramadol. wlll increase to 100mg  in AM, 50mg  prn in PM and 100mg  at bedtime 2.  For agitation and restlessness,increase propranolol to 40mg  TID 3.  Trial of ritalin 5mg  bid starting in about one week, depending upon response to other med to aid in distractibility and perpetual restlessness.  This may help with agitation secondarily also 4.  Baclofen has been effective for tone. Will titrate to 20mg  qid after 5 days.  5.  Continue Seroquel 100 mg at bedtime for sleep and agitation. This has been effective 6.  May stay with Celebrex and Tylenol as they are doing at home. prn 7.  consider klonopin trial for dystonia.   About an hour time was spent with the patient in direct contact and consultation today.  I will see him back in about 1 month's time

## 2019-09-19 NOTE — Patient Instructions (Signed)
1. INCREASE TRAMADOL 100-50-100 FOR PAIN 2. INCREASE PROPRANOLOL TO 40MG  THREE X DAILY 3. INCREASE BACLOFEN TO 20MG  THREE X DAILY  STARTING TOMORROW  IN 5 DAYS, INCREASE TO 20MG  4 X DAILY  4. IN ABOUT A WEEK, BEGIN RITALIN 5MG  DAILY AT 0700 AND 1200 (FOR ATTENTION AND FOCUS)

## 2019-09-20 ENCOUNTER — Other Ambulatory Visit: Payer: Self-pay

## 2019-09-20 DIAGNOSIS — F0630 Mood disorder due to known physiological condition, unspecified: Secondary | ICD-10-CM

## 2019-09-20 DIAGNOSIS — S062X3S Diffuse traumatic brain injury with loss of consciousness of 1 hour to 5 hours 59 minutes, sequela: Secondary | ICD-10-CM

## 2019-09-20 DIAGNOSIS — G894 Chronic pain syndrome: Secondary | ICD-10-CM

## 2019-09-20 DIAGNOSIS — S069X9S Unspecified intracranial injury with loss of consciousness of unspecified duration, sequela: Secondary | ICD-10-CM

## 2019-09-20 DIAGNOSIS — S069XAS Unspecified intracranial injury with loss of consciousness status unknown, sequela: Secondary | ICD-10-CM

## 2019-09-20 DIAGNOSIS — R252 Cramp and spasm: Secondary | ICD-10-CM

## 2019-09-20 MED ORDER — PROPRANOLOL HCL 40 MG PO TABS: 40.0000 mg | ORAL_TABLET | Freq: Three times a day (TID) | ORAL | 4 refills | Status: DC

## 2019-10-02 ENCOUNTER — Telehealth: Payer: Self-pay

## 2019-10-02 NOTE — Telephone Encounter (Signed)
Patients Guardian called stating that patient has been becoming even more increasingly agitated to the point where he is now hitting and butting himself AND OTHERS to the point where he is drawing blood.  States he always has blood coming out of his mouth and lips area. Has stated she does not know what to do or where to go at this point.  And has asked if she can receive a call from the provider.

## 2019-10-03 MED ORDER — QUETIAPINE FUMARATE 50 MG PO TABS: 50.0000 mg | ORAL_TABLET | ORAL | 2 refills | Status: DC

## 2019-10-03 NOTE — Telephone Encounter (Signed)
I spoke to Charles Arroyo today. She has not started ritalin.  Needs to do that immediately. Also will change seroquel to 50mg  qam and 150mg  QHS.

## 2019-10-03 NOTE — Addendum Note (Signed)
Addended by: Faith Rogue T on: 10/03/2019 08:18 AM   Modules accepted: Orders

## 2019-10-03 NOTE — Telephone Encounter (Signed)
Recieved message in cover my meds stating that for the patient to receive the increase in quetiapine medication a prior auth will have to be completed.  Prior auth started on 1056am by calling [employer]  Prior auth approved at 1058am with the prior auth number of [id number]  And it is approved until 09-27-2020.

## 2019-10-24 ENCOUNTER — Encounter: Payer: BC Managed Care – PPO | Attending: Psychology | Admitting: Physical Medicine & Rehabilitation

## 2019-10-24 ENCOUNTER — Encounter: Payer: Self-pay | Admitting: Physical Medicine & Rehabilitation

## 2019-10-24 ENCOUNTER — Other Ambulatory Visit: Payer: Self-pay

## 2019-10-24 VITALS — BP 114/76 | HR 91 | Temp 97.8°F | Ht 71.0 in | Wt 185.0 lb

## 2019-10-24 DIAGNOSIS — S062X3S Diffuse traumatic brain injury with loss of consciousness of 1 hour to 5 hours 59 minutes, sequela: Secondary | ICD-10-CM | POA: Diagnosis not present

## 2019-10-24 DIAGNOSIS — G894 Chronic pain syndrome: Secondary | ICD-10-CM | POA: Diagnosis not present

## 2019-10-24 DIAGNOSIS — R4689 Other symptoms and signs involving appearance and behavior: Secondary | ICD-10-CM | POA: Diagnosis present

## 2019-10-24 DIAGNOSIS — S069X0S Unspecified intracranial injury without loss of consciousness, sequela: Secondary | ICD-10-CM | POA: Insufficient documentation

## 2019-10-24 DIAGNOSIS — F0630 Mood disorder due to known physiological condition, unspecified: Secondary | ICD-10-CM | POA: Diagnosis not present

## 2019-10-24 DIAGNOSIS — M24522 Contracture, left elbow: Secondary | ICD-10-CM | POA: Insufficient documentation

## 2019-10-24 DIAGNOSIS — S069X9S Unspecified intracranial injury with loss of consciousness of unspecified duration, sequela: Secondary | ICD-10-CM

## 2019-10-24 DIAGNOSIS — S069XAS Unspecified intracranial injury with loss of consciousness status unknown, sequela: Secondary | ICD-10-CM

## 2019-10-24 DIAGNOSIS — S069X4D Unspecified intracranial injury with loss of consciousness of 6 hours to 24 hours, subsequent encounter: Secondary | ICD-10-CM | POA: Insufficient documentation

## 2019-10-24 DIAGNOSIS — R252 Cramp and spasm: Secondary | ICD-10-CM | POA: Diagnosis present

## 2019-10-24 MED ORDER — DANTROLENE SODIUM 25 MG PO CAPS: 25.0000 mg | ORAL_CAPSULE | Freq: Three times a day (TID) | ORAL | 3 refills | Status: DC

## 2019-10-24 MED ORDER — METHYLPHENIDATE HCL 10 MG PO TABS: 10.0000 mg | ORAL_TABLET | Freq: Two times a day (BID) | ORAL | 0 refills | Status: DC

## 2019-10-24 MED ORDER — HYDROCODONE-ACETAMINOPHEN 5-325 MG PO TABS: 1.0000 | ORAL_TABLET | Freq: Four times a day (QID) | ORAL | 0 refills | Status: DC | PRN

## 2019-10-24 NOTE — Patient Instructions (Addendum)
.  COVID-19 Vaccine Information can be found at: [email or web address] For questions related to vaccine distribution or appointments, please email [email or web address] or call [phone].   DANTRIUM: 25MG   ONE AT NIGHT FOR 4 DAYS  THEN ONE TWICE DAILY FOR 4 DAYS, THEN ONE THREE X DAILY   STOP TRAMADOL (HOLD ONTO THEM)  INCREASE RITALIN TO 10MG

## 2019-10-24 NOTE — Progress Notes (Signed)
Subjective:    Patient ID: Charles Arroyo, male    DOB: [birth year], 19 y.o.   MRN: [id number]  HPI  Teja is back today in follow-up of his traumatic brain injury and associated spastic left hemiparesis.  At last visit we adjusted his propranolol and started him on Ritalin.  We also increase his tramadol to help with pain levels.  Mom has noted less restlessness and that Finneus is more focused.  He even read a Dr. Steffanie Rainwater book one evening and is keeping up with a calendar. He brushes his teeth.  He is asked more questions about the world around him and he has made comments about what his life may look like a head.  Mom is concerned that he might be becoming more depressed.  I asked him him in the office today if he was depressed and he told me know.  Despite the improvements he still is perseverating on his left arm and biting quite frequently when someone attempts to bend or move the arm.  Pain is in his elbow as well as his left wrist and fingers.  The simple things such as hygiene are quite difficult.  I asked where Beryle Beams was having pain today and he responded in his wrist fingers and elbow.  Tramadol does provide some relief but not much according to mom.  He uses Celebrex and Tylenol with some benefit.  Mom has noted brown sediment in his urine often seen his diapers.  She showed me pictures today and is convinced is coming from his bladder.  She seen it in his urethral meatus.  He sometimes has blood in his urine as well.   Pain Inventory Average Pain 10 Pain Right Now 10 My pain is constant, sharp, burning, dull, stabbing, tingling and aching  In the last 24 hours, has pain interfered with the following? General activity 10 Relation with others 10 Enjoyment of life 10 What TIME of day is your pain at its worst? all Sleep (in general) Fair  Pain is worse with: walking, bending, sitting, inactivity, standing, unsure and some activites Pain improves with: rest Relief from Meds:  3  Mobility ability to climb steps?  no do you drive?  no  Function I need assistance with the following:  feeding, dressing, bathing, toileting, meal prep, household duties and shopping  Neuro/Psych bladder control problems bowel control problems weakness numbness tremor tingling trouble walking spasms dizziness confusion depression anxiety  Prior Studies Any changes since last visit?  no  Physicians involved in your care Any changes since last visit?  no   No family history on file. Social History   Socioeconomic History  . Marital status: Single    Spouse name: Not on file  . Number of children: Not on file  . Years of education: Not on file  . Highest education level: Not on file  Occupational History  . Not on file  Tobacco Use  . Smoking status: Former Smoker    Packs/day: 0.50    Years: 5.00    Pack years: 2.50    Types: Cigarettes    Quit date: 04/16/2018    Years since quitting: 1.5  . Smokeless tobacco: Former Engineer, water and Sexual Activity  . Alcohol use: No  . Drug use: No  . Sexual activity: Not on file  Other Topics Concern  . Not on file  Social History Narrative  . Not on file   Social Determinants of Health   Financial Resource Strain:   .  Difficulty of Paying Living Expenses: Not on file  Food Insecurity:   . Worried About Charity fundraiser in the Last Year: Not on file  . Ran Out of Food in the Last Year: Not on file  Transportation Needs:   . Lack of Transportation (Medical): Not on file  . Lack of Transportation (Non-Medical): Not on file  Physical Activity:   . Days of Exercise per Week: Not on file  . Minutes of Exercise per Session: Not on file  Stress:   . Feeling of Stress : Not on file  Social Connections:   . Frequency of Communication with Friends and Family: Not on file  . Frequency of Social Gatherings with Friends and Family: Not on file  . Attends Religious Services: Not on file  . Active Member of  Clubs or Organizations: Not on file  . Attends Archivist Meetings: Not on file  . Marital Status: Not on file   Past Surgical History:  Procedure Laterality Date  . broken clavicles  04/16/2018  . broken jaw  04/16/2018  . broken right heel  04/16/2018   Past Medical History:  Diagnosis Date  . Brain injury ([place])   . G tube feedings ([place])   . PE (pulmonary thromboembolism) ([place]) 05/2018   There were no vitals taken for this visit.  Opioid Risk Score:   Fall Risk Score:  `1  Depression screen PHQ 2/9  No flowsheet data found.\  Review of Systems  Constitutional: Negative.   HENT: Negative.   Eyes: Negative.   Respiratory: Negative.   Cardiovascular: Negative.   Gastrointestinal: Positive for abdominal pain and constipation.  Endocrine: Negative.   Genitourinary: Positive for difficulty urinating and dysuria.  Musculoskeletal: Positive for gait problem and myalgias.  Skin: Negative.   Allergic/Immunologic: Negative.   Neurological: Positive for tremors, weakness and numbness.  Hematological: Negative.   Psychiatric/Behavioral: Positive for confusion and dysphoric mood. The patient is nervous/anxious.   All other systems reviewed and are negative.      Objective:   Physical Exam General: No acute distress HEENT: EOMI, oral membranes moist Cards: reg rate  Chest: normal effort Abdomen: Soft, NT, ND Skin: Areas of abrasions and bites on the left hand as well as along the right arm. Extremities: no edema  Neuro:Patient much more calm today.  Able to attend to my questions easily.  He answers me appropriately most of the time.  With some cueing he is able to tell me where we were.  When I asked him if he was depressed he reported that he was not.  He answered other simple questions.  Left arm remains significantly spastic and contracted in flexion at the elbow and wrist.  He has extensor tone in the lower extremities left more than right.  I was able to  passively dorsiflex his left foot into neutral with some effort.  He moves the left leg fairly spontaneously.  He moves the right arm and leg without much limitations.   Musculoskeletal:Left elbow and wrist are tender with movement as noted above.  It is almost as if he has a reflexive response to any attempt at movement no matter how small and may be.Marland Kitchen   Psych:More calm and focused today.    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 4.Chronic pain syndrome related to the above. This is primarily related to his spasticity but there may be a neurogenic component as well. Also, from a behavioral standpoint he perseverates on pain and has poor impulse control as related to his brain injury 5.Insomnia related to the above 6.History of DVT on Xarelto   Plan: 1.  Will discontinue tramadol and begin trial of hydrocodone 5/325 1 every 8 hours as needed 2.For agitation and restlessness, continue propranolol  40mg  TID.  Would like to avoid further medication for his perseverative behavior if possible. 3.Increase Ritalin to 10 mg twice daily.  We have already seen improvement at the 5 mg dose.   4. Baclofen has been effective for tone.  Continue at 20mg  qid for now  -Begin trial of Dantrium 25 mg nightly titrating up to 3 times daily after 8 days  5.Continue Seroquel 100 mg at bedtime for sleep and agitation. This has been effective 6.May stay with Celebrex and Tylenol as they are doing at home. prn 7. The sediment in his urine is likely old blood/clots related to Xarelto.  His most recent lab work including urine was not very remarkable.  I would just observe this for now 8.  Would  like to get him back into therapy sometime in the not too distant future  About 30 minutes was spent with the patient in direct contact and consultation today. I will see him back in about 1 month's time

## 2019-11-09 NOTE — Telephone Encounter (Signed)
Communicated with mom re: adjustments in Cam's meds

## 2019-11-12 ENCOUNTER — Telehealth (HOSPITAL_COMMUNITY): Payer: Self-pay | Admitting: Physical Medicine & Rehabilitation

## 2019-11-12 DIAGNOSIS — G894 Chronic pain syndrome: Secondary | ICD-10-CM

## 2019-11-12 DIAGNOSIS — S062X3S Diffuse traumatic brain injury with loss of consciousness of 1 hour to 5 hours 59 minutes, sequela: Secondary | ICD-10-CM

## 2019-11-12 DIAGNOSIS — R252 Cramp and spasm: Secondary | ICD-10-CM

## 2019-11-12 DIAGNOSIS — F0630 Mood disorder due to known physiological condition, unspecified: Secondary | ICD-10-CM

## 2019-11-12 DIAGNOSIS — S069X9S Unspecified intracranial injury with loss of consciousness of unspecified duration, sequela: Secondary | ICD-10-CM

## 2019-11-12 MED ORDER — CITALOPRAM HYDROBROMIDE 10 MG PO TABS: 10.0000 mg | ORAL_TABLET | Freq: Every day | ORAL | 2 refills | Status: DC

## 2019-11-12 MED ORDER — DANTROLENE SODIUM 50 MG PO CAPS: 50.0000 mg | ORAL_CAPSULE | Freq: Three times a day (TID) | ORAL | 3 refills | Status: DC

## 2019-11-12 MED ORDER — DIVALPROEX SODIUM 250 MG PO DR TAB: 250.0000 mg | DELAYED_RELEASE_TABLET | Freq: Two times a day (BID) | ORAL | 2 refills | Status: DC

## 2019-11-12 MED ORDER — HYDROCODONE-ACETAMINOPHEN 10-325 MG PO TABS: 1.0000 | ORAL_TABLET | Freq: Three times a day (TID) | ORAL | 0 refills | Status: DC | PRN

## 2019-11-12 MED ORDER — PROPRANOLOL HCL 40 MG PO TABS: 20.0000 mg | ORAL_TABLET | Freq: Three times a day (TID) | ORAL | 4 refills | Status: DC

## 2019-11-12 NOTE — Telephone Encounter (Signed)
Made changes to regimen: Increased hydrocodone to 10/325 (this was done effectively a few days ago) Decrease propranolol to 20mg  tid for one week then off Dantrium : increase to 50mg  tid In one week begin depakote 250mg  bid

## 2019-11-12 NOTE — Telephone Encounter (Signed)
Also added celexa 10mg  qhs

## 2019-11-21 ENCOUNTER — Other Ambulatory Visit: Payer: Self-pay

## 2019-11-21 ENCOUNTER — Encounter: Payer: Self-pay | Admitting: Physical Medicine & Rehabilitation

## 2019-11-21 ENCOUNTER — Encounter: Payer: BC Managed Care – PPO | Attending: Psychology | Admitting: Physical Medicine & Rehabilitation

## 2019-11-21 VITALS — BP 123/80 | HR 103 | Temp 97.9°F | Ht 70.0 in | Wt 180.0 lb

## 2019-11-21 DIAGNOSIS — G894 Chronic pain syndrome: Secondary | ICD-10-CM

## 2019-11-21 DIAGNOSIS — R252 Cramp and spasm: Secondary | ICD-10-CM

## 2019-11-21 DIAGNOSIS — F0630 Mood disorder due to known physiological condition, unspecified: Secondary | ICD-10-CM | POA: Insufficient documentation

## 2019-11-21 DIAGNOSIS — S069X0S Unspecified intracranial injury without loss of consciousness, sequela: Secondary | ICD-10-CM | POA: Diagnosis present

## 2019-11-21 DIAGNOSIS — G890 Central pain syndrome: Secondary | ICD-10-CM

## 2019-11-21 DIAGNOSIS — M24522 Contracture, left elbow: Secondary | ICD-10-CM

## 2019-11-21 DIAGNOSIS — S062X3S Diffuse traumatic brain injury with loss of consciousness of 1 hour to 5 hours 59 minutes, sequela: Secondary | ICD-10-CM | POA: Diagnosis not present

## 2019-11-21 DIAGNOSIS — R4689 Other symptoms and signs involving appearance and behavior: Secondary | ICD-10-CM | POA: Insufficient documentation

## 2019-11-21 DIAGNOSIS — S069X9S Unspecified intracranial injury with loss of consciousness of unspecified duration, sequela: Secondary | ICD-10-CM | POA: Diagnosis present

## 2019-11-21 DIAGNOSIS — S069XAS Unspecified intracranial injury with loss of consciousness status unknown, sequela: Secondary | ICD-10-CM

## 2019-11-21 DIAGNOSIS — S069X4D Unspecified intracranial injury with loss of consciousness of 6 hours to 24 hours, subsequent encounter: Secondary | ICD-10-CM | POA: Diagnosis present

## 2019-11-21 MED ORDER — METHYLPHENIDATE HCL 10 MG PO TABS: 10.0000 mg | ORAL_TABLET | Freq: Two times a day (BID) | ORAL | 0 refills | Status: DC

## 2019-11-21 MED ORDER — DIVALPROEX SODIUM 500 MG PO DR TAB: 500.0000 mg | DELAYED_RELEASE_TABLET | Freq: Two times a day (BID) | ORAL | 3 refills | Status: DC

## 2019-11-21 MED ORDER — DIVALPROEX SODIUM 500 MG PO DR TAB: 500.0000 mg | DELAYED_RELEASE_TABLET | Freq: Three times a day (TID) | ORAL | 3 refills | Status: DC

## 2019-11-21 NOTE — Progress Notes (Signed)
Subjective:    Patient ID: Charles Arroyo, male    DOB: 12/10/2000, 19 y.o.   MRN: [id number]  HPI   This is a follow-up visit for [employer].  I saw him last month.  He continues to struggle with deficits related to his severe traumatic brain injury.  I talked to his mother a few times on the phone since we last met in person.  He has improved from a standpoint of his cognition and attention but still is having difficulties with pain in his left arm as well as spasticity.  He is perseverative at times and likes to bite and chew at his hand as well as his chest and those are trying to help him with self-care.  His grandmother was here today who also provides respite care for his mother.  She notes that he was very agitated yesterday.  He is also been more sleepy in general.  We have increased the Dantrium about 10 days ago and also started Depakote.  We had backed off his propranolol due to lack of obvious effect.  Additionally I started Celexa 10 mg at bedtime.  Grandmother states that he may be a little more amenable to hygiene for his arm but still it is a struggle every time.  She is able to slip a spatula with a wet wipe wanted to clean his elbow crease today.  Pain Inventory Average Pain 10 Pain Right Now 0 My pain is intermittent  In the last 24 hours, has pain interfered with the following? General activity 4 Relation with others 5 Enjoyment of life 5 What TIME of day is your pain at its worst? daytime Sleep (in general) Good  Pain is worse with: bending, sitting, inactivity, standing and some activites Pain improves with: rest and medication Relief from Meds: .  Mobility use a wheelchair  Function I need assistance with the following:  dressing, bathing, toileting, meal prep, household duties and shopping  Neuro/Psych bowel control problems spasms anxiety  Prior Studies Any changes since last visit?  no  Physicians involved in your care Any changes since last  visit?  no   No family history on file. Social History   Socioeconomic History  . Marital status: Single    Spouse name: Not on file  . Number of children: Not on file  . Years of education: Not on file  . Highest education level: Not on file  Occupational History  . Not on file  Tobacco Use  . Smoking status: Former Smoker    Packs/day: 0.50    Years: 5.00    Pack years: 2.50    Types: Cigarettes    Quit date: 04/16/2018    Years since quitting: 1.6  . Smokeless tobacco: Former Network engineer and Sexual Activity  . Alcohol use: No  . Drug use: No  . Sexual activity: Not on file  Other Topics Concern  . Not on file  Social History Narrative  . Not on file   Social Determinants of Health   Financial Resource Strain:   . Difficulty of Paying Living Expenses: Not on file  Food Insecurity:   . Worried About Charity fundraiser in the Last Year: Not on file  . Ran Out of Food in the Last Year: Not on file  Transportation Needs:   . Lack of Transportation (Medical): Not on file  . Lack of Transportation (Non-Medical): Not on file  Physical Activity:   . Days of Exercise per  Week: Not on file  . Minutes of Exercise per Session: Not on file  Stress:   . Feeling of Stress : Not on file  Social Connections:   . Frequency of Communication with Friends and Family: Not on file  . Frequency of Social Gatherings with Friends and Family: Not on file  . Attends Religious Services: Not on file  . Active Member of Clubs or Organizations: Not on file  . Attends Archivist Meetings: Not on file  . Marital Status: Not on file   Past Surgical History:  Procedure Laterality Date  . broken clavicles  04/16/2018  . broken jaw  04/16/2018  . broken right heel  04/16/2018   Past Medical History:  Diagnosis Date  . Brain injury ([place])   . G tube feedings ([place])   . PE (pulmonary thromboembolism) ([place]) 05/2018   BP 123/80   Pulse (!) 103   Temp 97.9 F (36.6 C)   Ht  '5\' 10"'  (1.778 m)   Wt 180 lb (81.6 kg)   SpO2 93%   BMI 25.83 kg/m   Opioid Risk Score:   Fall Risk Score:  `1  Depression screen PHQ 2/9  No flowsheet data found.   Review of Systems  Constitutional: Negative.   HENT: Negative.   Eyes: Negative.   Respiratory: Negative.   Cardiovascular: Negative.   Gastrointestinal: Positive for constipation.  Endocrine: Negative.   Genitourinary: Negative.   Musculoskeletal: Positive for arthralgias and myalgias.  Skin: Negative.   Allergic/Immunologic: Negative.   Neurological: Positive for weakness.  Hematological: Negative.   Psychiatric/Behavioral: The patient is nervous/anxious.        Objective:   Physical Exam  General: No acute distress HEENT: EOMI, oral membranes moist Cards: reg rate  Chest: normal effort Abdomen: Soft, NT, ND Skin: dry, intact Extremities: no edema In general he is fairly calm today.  He did seem to be a bit more sedated.  He did arouse easily and follow commands.  I did make attempts at trying to extend his elbow and wrist and he seemed to be less irritated by it although it was uncomfortable.  Elbow and wrist are contracted in flexion.  There may be some underlying tone as well but is difficult to say given the level of contracture.  He is able to respond to his name provide some answers to basic questions.  Short-term memory is poor.  Attention is better than it has been previously despite appearing fatigued.   Musc: He sits with poor posture in the bed tending to slide forward with his lumbar thoracic spine curved forward. Psych:More calm and focused overall but still labile.    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 4.Chronic pain syndrome related to the above. This is primarily related to his spasticity but there may be a neurogenic component as well. Also, from a behavioral standpoint he perseverates on pain and has poor impulse control as related to his brain injury 5.Insomnia related to the above 6.History of DVT on Xarelto   Plan: 1.   Continue hydrocodone 5/325 1 every 8 hours as needed.  No refills needed today 2.Weaning off propranolol and increasing Depakote to 500 mg twice daily.Marland Kitchen 3.Maintain Ritalin at 10 mg twice daily.  Refill was provided  today.   4.Baclofen has been effective for tone.  Continue at 64m qid for now   -Decrease Dantrium to 50 mg twice daily for 5 days then nightly only given his increased sedation. 5.Continue Seroquel 100 mg at bedtime for sleep and agitation.This has been effective 6.May stay with Celebrex and Tylenol as they are doing at home.prn 7.   Continue Celexa for depression which was started about 10 days ago 8.    Will bring him back for an attempt at Botox injections 400 units to the left biceps and to the brachial radialis.  He will need someone to help stabilize him during the procedure. 9. I would like to get him back into therapy sometime in the not too distant future  About 25 minutes was spent with the patient in direct contact and consultation today. I will see him back in about 1 month's time for Botox

## 2019-11-21 NOTE — Patient Instructions (Addendum)
PROPRANOLOL: REDUCE TO 20MG  TWICE DAILY FOR 4 DAYS, THEN 20MG  ONCE DAILY FOR 4 DAYS THEN OFF.   DANTRIUM: REDUCE TO 50MG  TWICE DAILY FOR 5 DAYS  THEN 50MG  AT BEDTIME THEREAFTER.

## 2019-12-04 ENCOUNTER — Ambulatory Visit (INDEPENDENT_AMBULATORY_CARE_PROVIDER_SITE_OTHER): Payer: BC Managed Care – PPO | Admitting: Internal Medicine

## 2019-12-06 ENCOUNTER — Encounter: Payer: Self-pay | Admitting: Psychology

## 2019-12-06 ENCOUNTER — Other Ambulatory Visit: Payer: Self-pay

## 2019-12-06 ENCOUNTER — Encounter (HOSPITAL_BASED_OUTPATIENT_CLINIC_OR_DEPARTMENT_OTHER): Payer: BC Managed Care – PPO | Admitting: Psychology

## 2019-12-06 DIAGNOSIS — S069XAS Unspecified intracranial injury with loss of consciousness status unknown, sequela: Secondary | ICD-10-CM

## 2019-12-06 DIAGNOSIS — R252 Cramp and spasm: Secondary | ICD-10-CM | POA: Diagnosis not present

## 2019-12-06 DIAGNOSIS — S062X3S Diffuse traumatic brain injury with loss of consciousness of 1 hour to 5 hours 59 minutes, sequela: Secondary | ICD-10-CM

## 2019-12-06 DIAGNOSIS — F0630 Mood disorder due to known physiological condition, unspecified: Secondary | ICD-10-CM | POA: Diagnosis not present

## 2019-12-06 DIAGNOSIS — S069X9S Unspecified intracranial injury with loss of consciousness of unspecified duration, sequela: Secondary | ICD-10-CM

## 2019-12-06 DIAGNOSIS — G894 Chronic pain syndrome: Secondary | ICD-10-CM
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
## 2019-12-17 ENCOUNTER — Other Ambulatory Visit (INDEPENDENT_AMBULATORY_CARE_PROVIDER_SITE_OTHER): Payer: Self-pay

## 2019-12-18 ENCOUNTER — Telehealth: Payer: Self-pay | Admitting: Physical Medicine & Rehabilitation

## 2019-12-21 ENCOUNTER — Other Ambulatory Visit: Payer: Self-pay | Admitting: Physical Medicine & Rehabilitation

## 2019-12-24 MED ORDER — HYDROCODONE-ACETAMINOPHEN 10-325 MG PO TABS: 1.0000 | ORAL_TABLET | Freq: Three times a day (TID) | ORAL | 0 refills | Status: DC | PRN

## 2019-12-24 NOTE — Telephone Encounter (Signed)
Patient called again for hydrocodone refill

## 2019-12-24 NOTE — Telephone Encounter (Signed)
Purvis Sheffield CMA reports he sent the request to Dr Riley Kill. The PMP and Dr Riley Kill note was reviewed.  Last Hydrocodone prescription was filled on 11/12/2019.

## 2019-12-25 ENCOUNTER — Other Ambulatory Visit: Payer: Self-pay | Admitting: Physical Medicine & Rehabilitation

## 2019-12-25 DIAGNOSIS — G894 Chronic pain syndrome: Secondary | ICD-10-CM

## 2019-12-25 DIAGNOSIS — S069X9S Unspecified intracranial injury with loss of consciousness of unspecified duration, sequela: Secondary | ICD-10-CM

## 2019-12-25 DIAGNOSIS — S062X3S Diffuse traumatic brain injury with loss of consciousness of 1 hour to 5 hours 59 minutes, sequela: Secondary | ICD-10-CM

## 2019-12-25 DIAGNOSIS — F0630 Mood disorder due to known physiological condition, unspecified: Secondary | ICD-10-CM

## 2019-12-25 DIAGNOSIS — R252 Cramp and spasm: Secondary | ICD-10-CM

## 2019-12-25 NOTE — Telephone Encounter (Signed)
Ritalin and hydrocodone both e-scribed today.

## 2020-01-03 ENCOUNTER — Other Ambulatory Visit: Payer: Self-pay

## 2020-01-03 ENCOUNTER — Telehealth: Payer: Self-pay | Admitting: *Deleted

## 2020-01-03 ENCOUNTER — Encounter: Payer: BC Managed Care – PPO | Attending: Psychology | Admitting: Psychology

## 2020-01-03 DIAGNOSIS — R252 Cramp and spasm: Secondary | ICD-10-CM

## 2020-01-03 DIAGNOSIS — R4689 Other symptoms and signs involving appearance and behavior: Secondary | ICD-10-CM | POA: Diagnosis present

## 2020-01-03 DIAGNOSIS — S062X3S Diffuse traumatic brain injury with loss of consciousness of 1 hour to 5 hours 59 minutes, sequela: Secondary | ICD-10-CM | POA: Insufficient documentation

## 2020-01-03 DIAGNOSIS — M24522 Contracture, left elbow: Secondary | ICD-10-CM | POA: Insufficient documentation

## 2020-01-03 DIAGNOSIS — G890 Central pain syndrome: Secondary | ICD-10-CM | POA: Insufficient documentation

## 2020-01-03 DIAGNOSIS — S069X9S Unspecified intracranial injury with loss of consciousness of unspecified duration, sequela: Secondary | ICD-10-CM | POA: Insufficient documentation

## 2020-01-03 DIAGNOSIS — F0630 Mood disorder due to known physiological condition, unspecified: Secondary | ICD-10-CM

## 2020-01-03 DIAGNOSIS — S069X4D Unspecified intracranial injury with loss of consciousness of 6 hours to 24 hours, subsequent encounter: Secondary | ICD-10-CM | POA: Diagnosis present

## 2020-01-03 DIAGNOSIS — S069X0S Unspecified intracranial injury without loss of consciousness, sequela: Secondary | ICD-10-CM | POA: Diagnosis present

## 2020-01-03 DIAGNOSIS — G894 Chronic pain syndrome: Secondary | ICD-10-CM | POA: Diagnosis not present

## 2020-01-03 NOTE — Telephone Encounter (Signed)
Charles Arroyo was in to see Dr Kieth Brightly and his grandmother pointed out that his ritalin they got from the pharmacy was 5 mg and not 10mg .  I verified from the pharmacist that they did fill with the old rx of 5 mg instead of changing it to 10 mg #60. I told her he can take 2 tablets bid until gone which should be mid month, and he will need a new rx sent over for the 10 mg tablets.

## 2020-01-04 MED ORDER — METHYLPHENIDATE HCL 10 MG PO TABS: 10.0000 mg | ORAL_TABLET | Freq: Two times a day (BID) | ORAL | 0 refills | Status: DC

## 2020-01-04 NOTE — Telephone Encounter (Signed)
Ritalin refilled. Please send the pharmacy a "heart-felt thank you" from me!

## 2020-01-07 ENCOUNTER — Encounter: Payer: Self-pay | Admitting: Psychology
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
## 2020-01-10 ENCOUNTER — Other Ambulatory Visit: Payer: Self-pay | Admitting: Physical Medicine & Rehabilitation

## 2020-01-16 ENCOUNTER — Other Ambulatory Visit: Payer: Self-pay

## 2020-01-16 ENCOUNTER — Encounter (HOSPITAL_BASED_OUTPATIENT_CLINIC_OR_DEPARTMENT_OTHER): Payer: BC Managed Care – PPO | Admitting: Physical Medicine & Rehabilitation

## 2020-01-16 ENCOUNTER — Encounter: Payer: Self-pay | Admitting: Physical Medicine & Rehabilitation

## 2020-01-16 DIAGNOSIS — F0630 Mood disorder due to known physiological condition, unspecified: Secondary | ICD-10-CM

## 2020-01-16 DIAGNOSIS — G8114 Spastic hemiplegia affecting left nondominant side: Secondary | ICD-10-CM | POA: Insufficient documentation

## 2020-01-16 DIAGNOSIS — S069X9S Unspecified intracranial injury with loss of consciousness of unspecified duration, sequela: Secondary | ICD-10-CM | POA: Diagnosis not present

## 2020-01-16 DIAGNOSIS — G890 Central pain syndrome: Secondary | ICD-10-CM

## 2020-01-16 MED ORDER — DIVALPROEX SODIUM 500 MG PO DR TAB: 500.0000 mg | DELAYED_RELEASE_TABLET | Freq: Three times a day (TID) | ORAL | 3 refills | Status: DC

## 2020-01-16 NOTE — Patient Instructions (Signed)
PLEASE FEEL FREE TO CALL OUR OFFICE WITH ANY PROBLEMS OR QUESTIONS ([phone])      

## 2020-01-16 NOTE — Progress Notes (Signed)
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 This includes bleeding, bruising, infection, excessive weakness, or medication side effects. A REMS form is on file and signed.  Needle: 89mm injectable monopolar needle electrode  left Number of units per muscle Pectoralis Major 0 units Pectoralis Minor 0 units Biceps 200 units Brachioradialis 100 units FCR 0 units FCU 0 units FDS 0 units FDP 0 units FPL 0 units Pronator Teres 0 units Pronator Quadratus 0 units Lumbricals 0 units All injections were done   after negative drawback for blood. The patient tolerated the procedure well. Post procedure instructions were given. Return in about 2 months (around 03/17/2020).

## 2020-01-24 ENCOUNTER — Encounter: Payer: BC Managed Care – PPO | Attending: Psychology | Admitting: Psychology

## 2020-01-24 DIAGNOSIS — S069X4D Unspecified intracranial injury with loss of consciousness of 6 hours to 24 hours, subsequent encounter: Secondary | ICD-10-CM | POA: Insufficient documentation

## 2020-01-24 DIAGNOSIS — R252 Cramp and spasm: Secondary | ICD-10-CM | POA: Insufficient documentation

## 2020-01-24 DIAGNOSIS — S069X0S Unspecified intracranial injury without loss of consciousness, sequela: Secondary | ICD-10-CM | POA: Insufficient documentation

## 2020-01-24 DIAGNOSIS — F0630 Mood disorder due to known physiological condition, unspecified: Secondary | ICD-10-CM | POA: Insufficient documentation

## 2020-01-24 DIAGNOSIS — G894 Chronic pain syndrome: Secondary | ICD-10-CM | POA: Insufficient documentation

## 2020-01-24 DIAGNOSIS — S062X3S Diffuse traumatic brain injury with loss of consciousness of 1 hour to 5 hours 59 minutes, sequela: Secondary | ICD-10-CM | POA: Insufficient documentation

## 2020-01-24 DIAGNOSIS — S069X9S Unspecified intracranial injury with loss of consciousness of unspecified duration, sequela: Secondary | ICD-10-CM | POA: Insufficient documentation

## 2020-01-24 DIAGNOSIS — M24522 Contracture, left elbow: Secondary | ICD-10-CM | POA: Insufficient documentation

## 2020-01-24 DIAGNOSIS — R4689 Other symptoms and signs involving appearance and behavior: Secondary | ICD-10-CM | POA: Insufficient documentation

## 2020-01-24 DIAGNOSIS — G890 Central pain syndrome: Secondary | ICD-10-CM | POA: Insufficient documentation

## 2020-01-28 ENCOUNTER — Telehealth: Payer: Self-pay

## 2020-01-28 NOTE — Telephone Encounter (Signed)
Charles Arroyo, mother of patient called and wanted to let Dr. Riley Kill know that patient has been admitted into [facility].

## 2020-02-05 MED ORDER — NYSTATIN 100000 UNIT/ML MT SUSP
500000.00 | OROMUCOSAL | Status: DC
Start: 2020-02-07 — End: 2020-02-05

## 2020-02-05 MED ORDER — SODIUM CHLORIDE 0.9 % IV SOLN
150.00 | INTRAVENOUS | Status: DC
Start: ? — End: 2020-02-05

## 2020-02-05 MED ORDER — DIVALPROEX SODIUM 250 MG PO DR TAB
500.00 | DELAYED_RELEASE_TABLET | ORAL | Status: DC
Start: 2020-02-05 — End: 2020-02-05

## 2020-02-05 MED ORDER — GENERIC EXTERNAL MEDICATION
Status: DC
Start: ? — End: 2020-02-05

## 2020-02-05 MED ORDER — DSS 100 MG PO CAPS
100.00 | ORAL_CAPSULE | ORAL | Status: DC
Start: ? — End: 2020-02-05

## 2020-02-05 MED ORDER — MORPHINE SULFATE 10 MG/ML IJ SOLN
2.00 | INTRAMUSCULAR | Status: DC
Start: ? — End: 2020-02-05

## 2020-02-05 MED ORDER — ONDANSETRON HCL 4 MG/2ML IJ SOLN
4.00 | INTRAMUSCULAR | Status: DC
Start: ? — End: 2020-02-05

## 2020-02-05 MED ORDER — HYDROCODONE-ACETAMINOPHEN 5-325 MG PO TABS
1.00 | ORAL_TABLET | ORAL | Status: DC
Start: ? — End: 2020-02-05

## 2020-02-05 MED ORDER — CELECOXIB 100 MG PO CAPS
100.00 | ORAL_CAPSULE | ORAL | Status: DC
Start: 2020-02-07 — End: 2020-02-05

## 2020-02-05 MED ORDER — FAMOTIDINE 20 MG PO TABS
20.00 | ORAL_TABLET | ORAL | Status: DC
Start: 2020-02-07 — End: 2020-02-05

## 2020-02-05 MED ORDER — METHYLPHENIDATE HCL 5 MG PO TABS
10.00 | ORAL_TABLET | ORAL | Status: DC
Start: 2020-02-08 — End: 2020-02-05

## 2020-02-05 MED ORDER — IPRATROPIUM-ALBUTEROL 0.5-2.5 (3) MG/3ML IN SOLN
3.00 | RESPIRATORY_TRACT | Status: DC
Start: ? — End: 2020-02-05

## 2020-02-05 MED ORDER — PANTOPRAZOLE SODIUM 40 MG IV SOLR
40.00 | INTRAVENOUS | Status: DC
Start: 2020-02-06 — End: 2020-02-05

## 2020-02-05 MED ORDER — POLYETHYLENE GLYCOL 3350 17 GM/SCOOP PO POWD
17.00 | ORAL | Status: DC
Start: 2020-02-08 — End: 2020-02-05

## 2020-02-05 MED ORDER — CITALOPRAM HYDROBROMIDE 20 MG PO TABS
10.00 | ORAL_TABLET | ORAL | Status: DC
Start: 2020-02-07 — End: 2020-02-05

## 2020-02-05 MED ORDER — RIVAROXABAN 10 MG PO TABS
10.00 | ORAL_TABLET | ORAL | Status: DC
Start: 2020-02-07 — End: 2020-02-05

## 2020-02-05 MED ORDER — QUETIAPINE FUMARATE 25 MG PO TABS
50.00 | ORAL_TABLET | ORAL | Status: DC
Start: 2020-02-07 — End: 2020-02-05

## 2020-02-05 MED ORDER — DANTROLENE SODIUM 50 MG PO CAPS
50.00 | ORAL_CAPSULE | ORAL | Status: DC
Start: 2020-02-07 — End: 2020-02-05

## 2020-02-05 MED ORDER — LEVOFLOXACIN IN D5W 750 MG/150ML IV SOLN
750.00 | INTRAVENOUS | Status: DC
Start: 2020-02-06 — End: 2020-02-05

## 2020-02-05 MED ORDER — BACLOFEN 10 MG PO TABS
20.00 | ORAL_TABLET | ORAL | Status: DC
Start: 2020-02-07 — End: 2020-02-05

## 2020-02-06 ENCOUNTER — Other Ambulatory Visit (HOSPITAL_COMMUNITY): Payer: Self-pay | Admitting: Specialist

## 2020-02-06 DIAGNOSIS — R1312 Dysphagia, oropharyngeal phase: Secondary | ICD-10-CM

## 2020-02-07 MED ORDER — HYDROCODONE-ACETAMINOPHEN 5-325 MG PO TABS
1.00 | ORAL_TABLET | ORAL | Status: DC
Start: 2020-02-08 — End: 2020-02-07

## 2020-02-07 MED ORDER — DIVALPROEX SODIUM 250 MG PO DR TAB
500.00 | DELAYED_RELEASE_TABLET | ORAL | Status: DC
Start: 2020-02-07 — End: 2020-02-07

## 2020-02-07 MED ORDER — DEXTROSE-SODIUM CHLORIDE 5-0.9 % IV SOLN
125.00 | INTRAVENOUS | Status: DC
Start: ? — End: 2020-02-07

## 2020-02-07 MED ORDER — GENERIC EXTERNAL MEDICATION
Status: DC
Start: ? — End: 2020-02-07

## 2020-02-08 ENCOUNTER — Ambulatory Visit (HOSPITAL_COMMUNITY)
Admission: RE | Admit: 2020-02-08 | Discharge: 2020-02-08 | Disposition: A | Discharge status: Home / Self Care | Payer: BC Managed Care – PPO | Source: Ambulatory Visit | Attending: Physician Assistant | Admitting: Physician Assistant

## 2020-02-08 ENCOUNTER — Other Ambulatory Visit: Payer: Self-pay

## 2020-02-08 ENCOUNTER — Ambulatory Visit (HOSPITAL_COMMUNITY): Payer: BC Managed Care – PPO | Admitting: Speech Pathology

## 2020-02-08 ENCOUNTER — Ambulatory Visit (HOSPITAL_COMMUNITY): Payer: BC Managed Care – PPO | Attending: Physician Assistant | Admitting: Speech Pathology

## 2020-02-08 DIAGNOSIS — R1312 Dysphagia, oropharyngeal phase: Secondary | ICD-10-CM | POA: Diagnosis not present

## 2020-02-08 MED ORDER — GENERIC EXTERNAL MEDICATION
Status: DC
Start: ? — End: 2020-02-08
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
## 2020-02-19 ENCOUNTER — Other Ambulatory Visit: Payer: Self-pay

## 2020-02-19 DIAGNOSIS — S069X9S Unspecified intracranial injury with loss of consciousness of unspecified duration, sequela: Secondary | ICD-10-CM

## 2020-02-19 DIAGNOSIS — R252 Cramp and spasm: Secondary | ICD-10-CM

## 2020-02-19 DIAGNOSIS — F0630 Mood disorder due to known physiological condition, unspecified: Secondary | ICD-10-CM

## 2020-02-19 DIAGNOSIS — S069XAS Unspecified intracranial injury with loss of consciousness status unknown, sequela: Secondary | ICD-10-CM

## 2020-02-19 DIAGNOSIS — S062X3S Diffuse traumatic brain injury with loss of consciousness of 1 hour to 5 hours 59 minutes, sequela: Secondary | ICD-10-CM

## 2020-02-19 DIAGNOSIS — G894 Chronic pain syndrome: Secondary | ICD-10-CM

## 2020-02-19 MED ORDER — METHYLPHENIDATE HCL 10 MG PO TABS: 10.0000 mg | ORAL_TABLET | Freq: Two times a day (BID) | ORAL | 0 refills | Status: DC

## 2020-02-19 MED ORDER — HYDROCODONE-ACETAMINOPHEN 10-325 MG PO TABS: 1.0000 | ORAL_TABLET | Freq: Four times a day (QID) | ORAL | 0 refills | Status: DC | PRN

## 2020-03-05 ENCOUNTER — Encounter: Payer: BC Managed Care – PPO | Attending: Psychology | Admitting: Physical Medicine & Rehabilitation

## 2020-03-05 ENCOUNTER — Encounter: Payer: Self-pay | Admitting: Physical Medicine & Rehabilitation

## 2020-03-05 ENCOUNTER — Other Ambulatory Visit: Payer: Self-pay

## 2020-03-05 VITALS — BP 125/79 | HR 100 | Temp 97.5°F | Ht 70.0 in | Wt 157.0 lb

## 2020-03-05 DIAGNOSIS — S062X3S Diffuse traumatic brain injury with loss of consciousness of 1 hour to 5 hours 59 minutes, sequela: Secondary | ICD-10-CM | POA: Insufficient documentation

## 2020-03-05 DIAGNOSIS — R252 Cramp and spasm: Secondary | ICD-10-CM | POA: Diagnosis present

## 2020-03-05 DIAGNOSIS — M24522 Contracture, left elbow: Secondary | ICD-10-CM | POA: Insufficient documentation

## 2020-03-05 DIAGNOSIS — M79604 Pain in right leg: Secondary | ICD-10-CM

## 2020-03-05 DIAGNOSIS — S062X4S Diffuse traumatic brain injury with loss of consciousness of 6 hours to 24 hours, sequela: Secondary | ICD-10-CM | POA: Diagnosis not present

## 2020-03-05 DIAGNOSIS — M79605 Pain in left leg: Secondary | ICD-10-CM | POA: Diagnosis present

## 2020-03-05 DIAGNOSIS — S069X4D Unspecified intracranial injury with loss of consciousness of 6 hours to 24 hours, subsequent encounter: Secondary | ICD-10-CM | POA: Diagnosis present

## 2020-03-05 DIAGNOSIS — S069X0S Unspecified intracranial injury without loss of consciousness, sequela: Secondary | ICD-10-CM | POA: Insufficient documentation

## 2020-03-05 DIAGNOSIS — B369 Superficial mycosis, unspecified: Secondary | ICD-10-CM | POA: Diagnosis present

## 2020-03-05 DIAGNOSIS — G890 Central pain syndrome: Secondary | ICD-10-CM | POA: Diagnosis present

## 2020-03-05 DIAGNOSIS — G894 Chronic pain syndrome: Secondary | ICD-10-CM | POA: Insufficient documentation

## 2020-03-05 DIAGNOSIS — S069X9S Unspecified intracranial injury with loss of consciousness of unspecified duration, sequela: Secondary | ICD-10-CM

## 2020-03-05 DIAGNOSIS — R4689 Other symptoms and signs involving appearance and behavior: Secondary | ICD-10-CM | POA: Insufficient documentation

## 2020-03-05 DIAGNOSIS — S069XAS Unspecified intracranial injury with loss of consciousness status unknown, sequela: Secondary | ICD-10-CM

## 2020-03-05 DIAGNOSIS — F0630 Mood disorder due to known physiological condition, unspecified: Secondary | ICD-10-CM | POA: Diagnosis present

## 2020-03-05 MED ORDER — FLUCONAZOLE 100 MG PO TABS: 100.0000 mg | ORAL_TABLET | Freq: Every day | ORAL | 0 refills | Status: AC

## 2020-03-05 MED ORDER — CITALOPRAM HYDROBROMIDE 20 MG PO TABS: 20.0000 mg | ORAL_TABLET | Freq: Every day | ORAL | 2 refills | Status: DC

## 2020-03-05 NOTE — Progress Notes (Signed)
Subjective:     Patient ID: Charles Arroyo, male   DOB: Aug 18, 2001, 19 y.o.   MRN: [id number]  HPI   Charles Arroyo is back regarding his TBI and related spasticity and cognitive-behavioral deficits. A couple weeks after I last saw him he fell at home and apparently aspirated which led to him developing a pneumonia/sepsis as well as pancreatitis. He was at a [facility] hospital for 12 days.  He lost a lot of weight and lost ground with his mobility and spasticity.  Mom noted that a few days prior to his complications that his left arm was beginning to loosen up.  Otherwise his mood, behavior has been better.  He is sleeping most nights.  He seems less depressed in general.  He even has been standing and assisting more with transfers lately.  He does have a tight left heel cord and the left arm remains very tight which it has been.  He has developed irritations and odors at in areas where he is frequently clenching down.  Pain Inventory Average Pain 10 Pain Right Now 8 My pain is constant and sharp  In the last 24 hours, has pain interfered with the following? General activity 10 Relation with others 10 Enjoyment of life 10 What TIME of day is your pain at its worst? MORNING & NIGHT Sleep (in general) Fair  Pain is worse with: bending Pain improves with: rest, heat/ice and medication Relief from Meds: 7  Mobility how many minutes can you walk? 0 use a wheelchair  Function disabled: date disabled 2019  Neuro/Psych bladder control problems bowel control problems weakness numbness tremor tingling trouble walking spasms confusion depression anxiety  Prior Studies Any changes since last visit?  yes [facility] in May 2021  Physicians involved in your care Any changes since last visit?  no   No family history on file. Social History   Socioeconomic History  . Marital status: Single    Spouse name: Not on file  . Number of children: Not on file  . Years of education: Not on file   . Highest education level: Not on file  Occupational History  . Not on file  Tobacco Use  . Smoking status: Former Smoker    Packs/day: 0.50    Years: 5.00    Pack years: 2.50    Types: Cigarettes    Quit date: 04/16/2018    Years since quitting: 1.8  . Smokeless tobacco: Former Network engineer  . Vaping Use: Former  . Quit date: 04/16/2018  . Substances: CBD  Substance and Sexual Activity  . Alcohol use: No  . Drug use: No  . Sexual activity: Not Currently  Other Topics Concern  . Not on file  Social History Narrative  . Not on file   Social Determinants of Health   Financial Resource Strain:   . Difficulty of Paying Living Expenses:   Food Insecurity:   . Worried About Charity fundraiser in the Last Year:   . Arboriculturist in the Last Year:   Transportation Needs:   . Film/video editor (Medical):   Marland Kitchen Lack of Transportation (Non-Medical):   Physical Activity:   . Days of Exercise per Week:   . Minutes of Exercise per Session:   Stress:   . Feeling of Stress :   Social Connections:   . Frequency of Communication with Friends and Family:   . Frequency of Social Gatherings with Friends and Family:   .  Attends Religious Services:   . Active Member of Clubs or Organizations:   . Attends Banker Meetings:   Marland Kitchen Marital Status:    Past Surgical History:  Procedure Laterality Date  . broken clavicles  04/16/2018  . broken jaw  04/16/2018  . broken right heel  04/16/2018   Past Medical History:  Diagnosis Date  . Brain injury ([facility])   . G tube feedings ([facility])   . PE (pulmonary thromboembolism) ([facility]) 05/2018   Temp (!) 97.5 F (36.4 C)   Ht 5\' 10"  (1.778 m)   Wt 157 lb (71.2 kg) Comment: EST-wheelchair  BMI 22.53 kg/m   Opioid Risk Score:   Fall Risk Score:  `1  Depression screen PHQ 2/9  Depression screen [facility] 2/9 03/05/2020 01/16/2020  Decreased Interest 0 1  Down, Depressed, Hopeless 0 1  PHQ - 2 Score 0 2  Altered sleeping 0 -   Tired, decreased energy 0 -  Change in appetite 0 -  Feeling bad or failure about yourself  0 -  Trouble concentrating 0 -  Moving slowly or fidgety/restless 0 -  Suicidal thoughts 0 -  PHQ-9 Score 0 -   Review of Systems  Constitutional: Negative.        Weight loss  HENT: Negative.   Eyes: Negative.   Respiratory: Negative.   Cardiovascular: Negative.   Gastrointestinal: Negative.        Incontinence    Endocrine: Negative.   Genitourinary: Positive for hematuria.  Musculoskeletal: Positive for back pain.       Left foot drop, tremor  Skin: Negative.        Easy bleeding  Neurological: Positive for weakness and numbness.       Tingling, spasams  Psychiatric/Behavioral: Positive for confusion.       Anxiety        Objective:   Physical Exam  General: No acute distress HEENT: EOMI, oral membranes moist Cards: reg rate  Chest: normal effort Abdomen: Soft, NT, ND Skin: Areas of redness and apparent fungal infection along the along the axilla and the wrist and hands to a lesser extent. Extremities: no edema He is very calm.  He actually was polite and asked questions today.   Ongoing tone is noted in the hand and wrist as well as elbow.   Attention appears better today..    Musc:  Sitting posture improved.  Likely contractures in the wrist and fingers of the left hand.  Left heel cord is tight but movable. Psych:  More calm and appropriate today.      Assessment & Plan:  1.  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 3.  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 4.  Chronic pain syndrome related to the above.  This is primarily related to his spasticity but there may be a neurogenic component as well.  Also, from a behavioral standpoint he  perseverates on pain and has poor impulse control as related to his brain injury 5.  Insomnia related to the above 6.  History of DVT on Xarelto     Plan: 1.     Continue hydrocodone 5/325 1 every 8 hours as needed.  just RF on 6/1 2.  Weaning off propranolol and increasing Depakote to 500 mg twice daily.. 3.  Maintain Ritalin at 10  mg twice daily.  Refill was provided 02/19/20   4.  Baclofen has been effective for tone.  Continue at 20mg  qid for now   -Continue dantrium nightly for now only which seems to help somewhat with sleep.  5.  Continue Seroquel 100 mg at bedtime for sleep and agitation. This remains effective 6.  May stay with Celebrex and Tylenol as they are doing at home. prn 7.   Consider surgical input for left hand and wrist 8.    Will bring him back for another attempt at Botox injections 400 units to the left biceps and to the brachial radialis.    It seems as if he was having success with the injections until he had his pneumonia setback.. 9.  Again, I would like to get him back into therapy sometime in the not too distant future   25 minutes was spent with the patient in direct contact and consultation today.  I will see him back in about  1 month for Botox

## 2020-03-05 NOTE — Patient Instructions (Signed)
PLEASE FEEL FREE TO CALL OUR OFFICE WITH ANY PROBLEMS OR QUESTIONS ([phone])      

## 2020-03-18 ENCOUNTER — Other Ambulatory Visit: Payer: Self-pay

## 2020-03-18 DIAGNOSIS — S062X3S Diffuse traumatic brain injury with loss of consciousness of 1 hour to 5 hours 59 minutes, sequela: Secondary | ICD-10-CM

## 2020-03-18 DIAGNOSIS — S069X9S Unspecified intracranial injury with loss of consciousness of unspecified duration, sequela: Secondary | ICD-10-CM

## 2020-03-18 DIAGNOSIS — F0630 Mood disorder due to known physiological condition, unspecified: Secondary | ICD-10-CM

## 2020-03-18 DIAGNOSIS — R252 Cramp and spasm: Secondary | ICD-10-CM

## 2020-03-18 DIAGNOSIS — S069XAS Unspecified intracranial injury with loss of consciousness status unknown, sequela: Secondary | ICD-10-CM

## 2020-03-18 DIAGNOSIS — G894 Chronic pain syndrome: Secondary | ICD-10-CM

## 2020-03-18 MED ORDER — METHYLPHENIDATE HCL 10 MG PO TABS: 10.0000 mg | ORAL_TABLET | Freq: Two times a day (BID) | ORAL | 0 refills | Status: DC

## 2020-04-01 ENCOUNTER — Other Ambulatory Visit: Payer: Self-pay | Admitting: Physical Medicine & Rehabilitation

## 2020-04-01 DIAGNOSIS — S062X3S Diffuse traumatic brain injury with loss of consciousness of 1 hour to 5 hours 59 minutes, sequela: Secondary | ICD-10-CM

## 2020-04-01 DIAGNOSIS — S069X9S Unspecified intracranial injury with loss of consciousness of unspecified duration, sequela: Secondary | ICD-10-CM

## 2020-04-01 DIAGNOSIS — F0630 Mood disorder due to known physiological condition, unspecified: Secondary | ICD-10-CM

## 2020-04-01 DIAGNOSIS — R252 Cramp and spasm: Secondary | ICD-10-CM

## 2020-04-01 DIAGNOSIS — S069XAS Unspecified intracranial injury with loss of consciousness status unknown, sequela: Secondary | ICD-10-CM

## 2020-04-01 DIAGNOSIS — G894 Chronic pain syndrome: Secondary | ICD-10-CM

## 2020-04-10 ENCOUNTER — Other Ambulatory Visit: Payer: Self-pay

## 2020-04-10 MED ORDER — HYDROCODONE-ACETAMINOPHEN 10-325 MG PO TABS: 1.0000 | ORAL_TABLET | Freq: Four times a day (QID) | ORAL | 0 refills | Status: DC | PRN

## 2020-04-24 ENCOUNTER — Other Ambulatory Visit: Payer: Self-pay

## 2020-04-24 DIAGNOSIS — S069X9S Unspecified intracranial injury with loss of consciousness of unspecified duration, sequela: Secondary | ICD-10-CM

## 2020-04-24 DIAGNOSIS — S062X3S Diffuse traumatic brain injury with loss of consciousness of 1 hour to 5 hours 59 minutes, sequela: Secondary | ICD-10-CM

## 2020-04-24 DIAGNOSIS — R252 Cramp and spasm: Secondary | ICD-10-CM

## 2020-04-24 DIAGNOSIS — F0630 Mood disorder due to known physiological condition, unspecified: Secondary | ICD-10-CM

## 2020-04-24 DIAGNOSIS — G894 Chronic pain syndrome: Secondary | ICD-10-CM

## 2020-04-24 MED ORDER — METHYLPHENIDATE HCL 10 MG PO TABS: 10.0000 mg | ORAL_TABLET | Freq: Two times a day (BID) | ORAL | 0 refills | Status: DC

## 2020-04-24 NOTE — Telephone Encounter (Signed)
Refill request for Methylphenidate 

## 2020-05-06 ENCOUNTER — Ambulatory Visit (INDEPENDENT_AMBULATORY_CARE_PROVIDER_SITE_OTHER): Payer: BC Managed Care – PPO

## 2020-05-06 ENCOUNTER — Other Ambulatory Visit: Payer: Self-pay | Admitting: Physical Medicine & Rehabilitation

## 2020-05-06 DIAGNOSIS — F0630 Mood disorder due to known physiological condition, unspecified: Secondary | ICD-10-CM

## 2020-05-06 DIAGNOSIS — S069X9S Unspecified intracranial injury with loss of consciousness of unspecified duration, sequela: Secondary | ICD-10-CM

## 2020-05-06 DIAGNOSIS — M79605 Pain in left leg: Secondary | ICD-10-CM

## 2020-05-06 DIAGNOSIS — M79604 Pain in right leg: Secondary | ICD-10-CM | POA: Diagnosis not present

## 2020-05-06 DIAGNOSIS — S069XAS Unspecified intracranial injury with loss of consciousness status unknown, sequela: Secondary | ICD-10-CM

## 2020-05-07 ENCOUNTER — Encounter: Payer: Self-pay | Admitting: Physical Medicine & Rehabilitation

## 2020-05-07 ENCOUNTER — Other Ambulatory Visit: Payer: Self-pay

## 2020-05-07 ENCOUNTER — Encounter: Payer: BC Managed Care – PPO | Attending: Psychology | Admitting: Physical Medicine & Rehabilitation

## 2020-05-07 VITALS — BP 132/89 | HR 106 | Temp 98.2°F | Ht 70.0 in | Wt 157.0 lb

## 2020-05-07 DIAGNOSIS — G890 Central pain syndrome: Secondary | ICD-10-CM | POA: Insufficient documentation

## 2020-05-07 DIAGNOSIS — S062X4S Diffuse traumatic brain injury with loss of consciousness of 6 hours to 24 hours, sequela: Secondary | ICD-10-CM | POA: Insufficient documentation

## 2020-05-07 DIAGNOSIS — R252 Cramp and spasm: Secondary | ICD-10-CM | POA: Diagnosis present

## 2020-05-07 DIAGNOSIS — B369 Superficial mycosis, unspecified: Secondary | ICD-10-CM | POA: Diagnosis present

## 2020-05-07 DIAGNOSIS — R4689 Other symptoms and signs involving appearance and behavior: Secondary | ICD-10-CM | POA: Insufficient documentation

## 2020-05-07 DIAGNOSIS — S069X9S Unspecified intracranial injury with loss of consciousness of unspecified duration, sequela: Secondary | ICD-10-CM | POA: Diagnosis present

## 2020-05-07 DIAGNOSIS — S062X3S Diffuse traumatic brain injury with loss of consciousness of 1 hour to 5 hours 59 minutes, sequela: Secondary | ICD-10-CM | POA: Insufficient documentation

## 2020-05-07 DIAGNOSIS — G894 Chronic pain syndrome: Secondary | ICD-10-CM | POA: Insufficient documentation

## 2020-05-07 DIAGNOSIS — G8114 Spastic hemiplegia affecting left nondominant side: Secondary | ICD-10-CM | POA: Insufficient documentation

## 2020-05-07 DIAGNOSIS — S069X4D Unspecified intracranial injury with loss of consciousness of 6 hours to 24 hours, subsequent encounter: Secondary | ICD-10-CM | POA: Insufficient documentation

## 2020-05-07 DIAGNOSIS — F0630 Mood disorder due to known physiological condition, unspecified: Secondary | ICD-10-CM | POA: Insufficient documentation

## 2020-05-07 DIAGNOSIS — S069X0S Unspecified intracranial injury without loss of consciousness, sequela: Secondary | ICD-10-CM | POA: Insufficient documentation

## 2020-05-07 DIAGNOSIS — M79604 Pain in right leg: Secondary | ICD-10-CM | POA: Insufficient documentation

## 2020-05-07 DIAGNOSIS — M79605 Pain in left leg: Secondary | ICD-10-CM | POA: Insufficient documentation

## 2020-05-07 DIAGNOSIS — M24522 Contracture, left elbow: Secondary | ICD-10-CM | POA: Diagnosis present

## 2020-05-07 NOTE — Progress Notes (Signed)
Botox Injection for spasticity of upper extremity using needle EMG guidance Indication: Diffuse brain injury, with loss of consciousness of 6 hours to 24 hours, sequela ([facility]) - Plan: Ambulatory referral to Physical Therapy, Ambulatory referral to Occupational Therapy  Spastic hemiparesis of left nondominant side ([facility]) - Plan: Ambulatory referral to Physical Therapy, Ambulatory referral to Occupational Therapy   Dilution: 100 Units/ml        Total Units Injected: 400 Indication: Severe spasticity which interferes with ADL,mobility and/or  hygiene and is unresponsive to medication management and other conservative care Informed consent was obtained after describing risks and benefits of the procedure with the patient. This includes bleeding, bruising, infection, excessive weakness, or medication side effects. A REMS form is on file and signed.  Needle: 57mm injectable monopolar needle electrode    Number of units per muscle Pectoralis Major 0 units Pectoralis Minor 0 units Biceps 300 units Brachioradialis 100 units FCR 0 units FCU 0 units FDS 0 units FDP 0 units FPL 0 units Palmaris Longus 0 units Pronator Teres 0 units Pronator Quadratus 0 units Lumbricals 0 units All injections were done after obtaining appropriate EMG activity and after negative drawback for blood. The patient tolerated the procedure well. Post procedure instructions were given. Return in about 2 months (around 07/07/2020).  WEAN OFF BACLOFEN. RESUME DANTRIUM 50MG , TITRATING UP TO TID IF TOLERATED  CONSIDER INCREASING SEROQUEL FOR AGITATION

## 2020-05-07 NOTE — Patient Instructions (Addendum)
BACLOFEN TAPER 20MG : DANTRIUM 50MG  :  DAYS 1-5 BACLOFEN 20MG   TWICE DAILY DANTRIUM ONCE DAILY AT BEDTIME  DAYS 6-10 BACLOFEN ONCE DAILY AT BEDTIME DANTRIUM TWICE DAILY  DAYS 11+ BACLOFEN OFF DANTRIUM 50MG  THREE X DAILY   CALL ME IN 2 WEEKS TO CHECK IN.

## 2020-05-20 ENCOUNTER — Other Ambulatory Visit: Payer: Self-pay | Admitting: Physical Medicine & Rehabilitation

## 2020-05-20 DIAGNOSIS — G890 Central pain syndrome: Secondary | ICD-10-CM

## 2020-05-20 DIAGNOSIS — S069X9S Unspecified intracranial injury with loss of consciousness of unspecified duration, sequela: Secondary | ICD-10-CM

## 2020-05-20 DIAGNOSIS — S069XAS Unspecified intracranial injury with loss of consciousness status unknown, sequela: Secondary | ICD-10-CM

## 2020-05-20 DIAGNOSIS — F0630 Mood disorder due to known physiological condition, unspecified: Secondary | ICD-10-CM

## 2020-05-21 ENCOUNTER — Other Ambulatory Visit: Payer: Self-pay

## 2020-05-21 ENCOUNTER — Ambulatory Visit: Payer: BC Managed Care – PPO | Attending: Internal Medicine

## 2020-05-21 DIAGNOSIS — R252 Cramp and spasm: Secondary | ICD-10-CM | POA: Diagnosis present

## 2020-05-21 DIAGNOSIS — R293 Abnormal posture: Secondary | ICD-10-CM

## 2020-05-21 DIAGNOSIS — G8114 Spastic hemiplegia affecting left nondominant side: Secondary | ICD-10-CM | POA: Diagnosis not present

## 2020-05-21 DIAGNOSIS — R2689 Other abnormalities of gait and mobility: Secondary | ICD-10-CM | POA: Diagnosis present
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
## 2020-05-27 ENCOUNTER — Other Ambulatory Visit: Payer: Self-pay

## 2020-05-27 DIAGNOSIS — S069XAS Unspecified intracranial injury with loss of consciousness status unknown, sequela: Secondary | ICD-10-CM

## 2020-05-27 DIAGNOSIS — S069X9S Unspecified intracranial injury with loss of consciousness of unspecified duration, sequela: Secondary | ICD-10-CM

## 2020-05-27 DIAGNOSIS — S062X3S Diffuse traumatic brain injury with loss of consciousness of 1 hour to 5 hours 59 minutes, sequela: Secondary | ICD-10-CM

## 2020-05-27 DIAGNOSIS — R252 Cramp and spasm: Secondary | ICD-10-CM

## 2020-05-27 DIAGNOSIS — G894 Chronic pain syndrome: Secondary | ICD-10-CM

## 2020-05-27 DIAGNOSIS — F0630 Mood disorder due to known physiological condition, unspecified: Secondary | ICD-10-CM

## 2020-05-27 NOTE — Telephone Encounter (Signed)
Refill request for Methylphenidate and Hydrocodone/APAP

## 2020-05-28 MED ORDER — METHYLPHENIDATE HCL 10 MG PO TABS: 10.0000 mg | ORAL_TABLET | Freq: Two times a day (BID) | ORAL | 0 refills | Status: DC

## 2020-05-28 MED ORDER — HYDROCODONE-ACETAMINOPHEN 10-325 MG PO TABS: 1.0000 | ORAL_TABLET | Freq: Four times a day (QID) | ORAL | 0 refills | Status: DC | PRN

## 2020-05-29 ENCOUNTER — Ambulatory Visit: Payer: BC Managed Care – PPO | Admitting: Occupational Therapy

## 2020-06-03 ENCOUNTER — Other Ambulatory Visit: Payer: Self-pay | Admitting: Physical Medicine & Rehabilitation

## 2020-06-04 ENCOUNTER — Other Ambulatory Visit: Payer: Self-pay

## 2020-06-04 ENCOUNTER — Ambulatory Visit: Payer: BC Managed Care – PPO | Admitting: Occupational Therapy

## 2020-06-04 ENCOUNTER — Encounter: Payer: BC Managed Care – PPO | Admitting: Occupational Therapy

## 2020-06-04 DIAGNOSIS — G8114 Spastic hemiplegia affecting left nondominant side: Secondary | ICD-10-CM | POA: Diagnosis not present

## 2020-06-04 DIAGNOSIS — R252 Cramp and spasm: Secondary | ICD-10-CM

## 2020-06-04 DIAGNOSIS — R293 Abnormal posture: Secondary | ICD-10-CM
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
## 2020-06-05 ENCOUNTER — Other Ambulatory Visit: Payer: Self-pay

## 2020-06-10 ENCOUNTER — Other Ambulatory Visit: Payer: Self-pay

## 2020-06-10 ENCOUNTER — Ambulatory Visit: Payer: BC Managed Care – PPO | Admitting: Physical Therapy

## 2020-06-10 ENCOUNTER — Encounter: Payer: Self-pay | Admitting: Physical Therapy

## 2020-06-10 DIAGNOSIS — G8114 Spastic hemiplegia affecting left nondominant side: Secondary | ICD-10-CM | POA: Diagnosis not present

## 2020-06-10 DIAGNOSIS — R293 Abnormal posture: Secondary | ICD-10-CM

## 2020-06-10 DIAGNOSIS — R2689 Other abnormalities of gait and mobility: Secondary | ICD-10-CM
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
## 2020-06-12 ENCOUNTER — Ambulatory Visit: Payer: BC Managed Care – PPO

## 2020-06-17 ENCOUNTER — Ambulatory Visit: Payer: BC Managed Care – PPO

## 2020-06-17 ENCOUNTER — Other Ambulatory Visit: Payer: Self-pay

## 2020-06-17 DIAGNOSIS — R2689 Other abnormalities of gait and mobility: Secondary | ICD-10-CM

## 2020-06-17 DIAGNOSIS — G8114 Spastic hemiplegia affecting left nondominant side: Secondary | ICD-10-CM

## 2020-06-17 DIAGNOSIS — R293 Abnormal posture: Secondary | ICD-10-CM
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
## 2020-06-20 ENCOUNTER — Ambulatory Visit: Payer: BC Managed Care – PPO | Admitting: Physical Therapy

## 2020-06-24 ENCOUNTER — Ambulatory Visit: Payer: BC Managed Care – PPO

## 2020-06-24 ENCOUNTER — Encounter: Payer: BC Managed Care – PPO | Admitting: Occupational Therapy

## 2020-06-25 ENCOUNTER — Ambulatory Visit: Payer: BC Managed Care – PPO | Attending: Internal Medicine | Admitting: Occupational Therapy

## 2020-06-25 ENCOUNTER — Encounter: Payer: Self-pay | Admitting: Occupational Therapy

## 2020-06-25 ENCOUNTER — Other Ambulatory Visit: Payer: Self-pay

## 2020-06-25 DIAGNOSIS — R252 Cramp and spasm: Secondary | ICD-10-CM | POA: Insufficient documentation

## 2020-06-25 DIAGNOSIS — R2689 Other abnormalities of gait and mobility: Secondary | ICD-10-CM | POA: Diagnosis present

## 2020-06-25 DIAGNOSIS — R293 Abnormal posture: Secondary | ICD-10-CM | POA: Diagnosis present

## 2020-06-25 DIAGNOSIS — G8114 Spastic hemiplegia affecting left nondominant side: Secondary | ICD-10-CM | POA: Diagnosis not present
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
## 2020-06-26 ENCOUNTER — Ambulatory Visit: Payer: BC Managed Care – PPO | Admitting: Physical Therapy

## 2020-06-26 ENCOUNTER — Encounter: Payer: BC Managed Care – PPO | Admitting: Occupational Therapy

## 2020-06-26 DIAGNOSIS — R293 Abnormal posture: Secondary | ICD-10-CM

## 2020-06-26 DIAGNOSIS — R2689 Other abnormalities of gait and mobility: Secondary | ICD-10-CM

## 2020-06-26 DIAGNOSIS — G8114 Spastic hemiplegia affecting left nondominant side: Secondary | ICD-10-CM

## 2020-06-27 ENCOUNTER — Encounter: Payer: Self-pay | Admitting: Physical Therapy
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
## 2020-06-30 ENCOUNTER — Other Ambulatory Visit: Payer: Self-pay

## 2020-06-30 DIAGNOSIS — S069X9S Unspecified intracranial injury with loss of consciousness of unspecified duration, sequela: Secondary | ICD-10-CM

## 2020-06-30 DIAGNOSIS — F0630 Mood disorder due to known physiological condition, unspecified: Secondary | ICD-10-CM

## 2020-06-30 DIAGNOSIS — S062X3S Diffuse traumatic brain injury with loss of consciousness of 1 hour to 5 hours 59 minutes, sequela: Secondary | ICD-10-CM

## 2020-06-30 DIAGNOSIS — G890 Central pain syndrome: Secondary | ICD-10-CM

## 2020-06-30 DIAGNOSIS — G894 Chronic pain syndrome: Secondary | ICD-10-CM

## 2020-06-30 DIAGNOSIS — S069XAS Unspecified intracranial injury with loss of consciousness status unknown, sequela: Secondary | ICD-10-CM

## 2020-06-30 DIAGNOSIS — R252 Cramp and spasm: Secondary | ICD-10-CM

## 2020-07-01 ENCOUNTER — Encounter: Payer: BC Managed Care – PPO | Admitting: Occupational Therapy

## 2020-07-01 ENCOUNTER — Ambulatory Visit: Payer: BC Managed Care – PPO

## 2020-07-01 MED ORDER — METHYLPHENIDATE HCL 10 MG PO TABS: 10.0000 mg | ORAL_TABLET | Freq: Two times a day (BID) | ORAL | 0 refills | Status: DC

## 2020-07-01 MED ORDER — CITALOPRAM HYDROBROMIDE 20 MG PO TABS: 20.0000 mg | ORAL_TABLET | Freq: Every day | ORAL | 0 refills | Status: DC

## 2020-07-01 MED ORDER — QUETIAPINE FUMARATE 50 MG PO TABS: ORAL_TABLET | ORAL | 0 refills | Status: DC

## 2020-07-01 MED ORDER — DIVALPROEX SODIUM 500 MG PO DR TAB: 500.0000 mg | DELAYED_RELEASE_TABLET | Freq: Two times a day (BID) | ORAL | 0 refills | Status: DC

## 2020-07-01 MED ORDER — HYDROCODONE-ACETAMINOPHEN 10-325 MG PO TABS: 1.0000 | ORAL_TABLET | Freq: Four times a day (QID) | ORAL | 0 refills | Status: DC | PRN

## 2020-07-01 NOTE — Telephone Encounter (Signed)
PMP was Reviewed: Dr Micheal Likens note was reviewed. Ritalin and Hydrocodone was e-scribed. Placed a call to Ms. Loree  ( Mother) regarding the above, no answer. Left message regarding refill and  to return the call if she has an questions. Marland Kitchen

## 2020-07-01 NOTE — Telephone Encounter (Signed)
I spoke with Ms Kitchings and the correct schedule of depakote was verified (bid).  Jacalyn Lefevre NP will refill the methylphenidate and hydrocodone. They were last filled 05/28/20 per the PMP.

## 2020-07-01 NOTE — Telephone Encounter (Signed)
I have left a message for [employer], Milledge's mother, to get information about how he is taking some of the medications requested for refill. Await a call back.

## 2020-07-03 ENCOUNTER — Ambulatory Visit: Payer: BC Managed Care – PPO

## 2020-07-04 ENCOUNTER — Telehealth: Payer: Self-pay | Admitting: *Deleted

## 2020-07-04 NOTE — Telephone Encounter (Signed)
Prior authorization for hydrocodone-acetaminophen 10-325 mg is APPROVED.  07/02/2020 - 12/29/2020

## 2020-07-08 ENCOUNTER — Ambulatory Visit: Payer: BC Managed Care – PPO

## 2020-07-10 ENCOUNTER — Encounter: Payer: BC Managed Care – PPO | Admitting: Occupational Therapy

## 2020-07-10 ENCOUNTER — Ambulatory Visit: Payer: BC Managed Care – PPO | Admitting: Occupational Therapy

## 2020-07-10 ENCOUNTER — Ambulatory Visit: Payer: BC Managed Care – PPO | Admitting: Physical Therapy

## 2020-07-15 ENCOUNTER — Encounter: Payer: Self-pay | Admitting: Occupational Therapy

## 2020-07-15 ENCOUNTER — Ambulatory Visit: Payer: BC Managed Care – PPO | Admitting: Occupational Therapy

## 2020-07-15 ENCOUNTER — Other Ambulatory Visit: Payer: Self-pay

## 2020-07-15 ENCOUNTER — Ambulatory Visit: Payer: BC Managed Care – PPO | Admitting: Physical Therapy

## 2020-07-15 DIAGNOSIS — G8114 Spastic hemiplegia affecting left nondominant side: Secondary | ICD-10-CM

## 2020-07-15 DIAGNOSIS — R293 Abnormal posture: Secondary | ICD-10-CM

## 2020-07-15 DIAGNOSIS — R252 Cramp and spasm: Secondary | ICD-10-CM

## 2020-07-15 DIAGNOSIS — R2689 Other abnormalities of gait and mobility: Secondary | ICD-10-CM
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
## 2020-07-16 ENCOUNTER — Encounter: Payer: Self-pay | Admitting: Physical Medicine & Rehabilitation

## 2020-07-16 ENCOUNTER — Other Ambulatory Visit: Payer: Self-pay

## 2020-07-16 ENCOUNTER — Encounter: Payer: Self-pay | Admitting: Physical Therapy

## 2020-07-16 ENCOUNTER — Encounter: Payer: BC Managed Care – PPO | Attending: Psychology | Admitting: Physical Medicine & Rehabilitation

## 2020-07-16 VITALS — BP 120/83 | HR 94 | Temp 98.1°F

## 2020-07-16 DIAGNOSIS — S069X9S Unspecified intracranial injury with loss of consciousness of unspecified duration, sequela: Secondary | ICD-10-CM | POA: Diagnosis present

## 2020-07-16 DIAGNOSIS — F0630 Mood disorder due to known physiological condition, unspecified: Secondary | ICD-10-CM | POA: Diagnosis not present

## 2020-07-16 DIAGNOSIS — G8114 Spastic hemiplegia affecting left nondominant side: Secondary | ICD-10-CM | POA: Diagnosis not present

## 2020-07-16 DIAGNOSIS — S069XAS Unspecified intracranial injury with loss of consciousness status unknown, sequela: Secondary | ICD-10-CM

## 2020-07-16 DIAGNOSIS — R252 Cramp and spasm: Secondary | ICD-10-CM

## 2020-07-16 DIAGNOSIS — Z87820 Personal history of traumatic brain injury: Secondary | ICD-10-CM

## 2020-07-16 MED ORDER — DANTROLENE SODIUM 50 MG PO CAPS: 50.0000 mg | ORAL_CAPSULE | Freq: Four times a day (QID) | ORAL | 3 refills | Status: DC
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
## 2020-07-16 NOTE — Patient Instructions (Addendum)
PLEASE FEEL FREE TO CALL OUR OFFICE WITH ANY PROBLEMS OR QUESTIONS [phone])  IF INCREASE IN DANTROLENE TO 4X DAILY CAUSES TOO MUCH SEDATION, THEN BACK TO 3X DAILY

## 2020-07-16 NOTE — Progress Notes (Signed)
Subjective:    Patient ID: Charles Arroyo, male    DOB: Feb 17, 2001, 19 y.o.   MRN: [id number]  HPI   Charles Arroyo is back today regarding his spastic left hemiparesis and TBI.  We performed Botox to his left biceps and brachioradialis at last visit with modest benefit.  He has started outpatient PT and OT to address mobility and spasticity.  He has had some improvement in his trunk control and mobility.  They are doing assessment for wheelchair as well.  Mom felt that he was becoming too sedated and at times agitated on the medication regimen we had him on previously.  Independently she stopped the Seroquel and methylphenidate and reduced the Depakote to 250 in the morning and 500 mg in the p.m.  Since then family has noted improvement in his agitation and he generally is more alert.  Charles Arroyo seems to be less irritable at times.  He has been biting his left hand less.  And an arm hygiene still are difficult due to his positioning.   Pain Inventory Average Pain 8 Pain Right Now 2 My pain is sharp  In the last 24 hours, has pain interfered with the following? General activity 6 Relation with others 3 Enjoyment of life 6 What TIME of day is your pain at its worst? evening Sleep (in general) Good  Pain is worse with: bending and sitting Pain improves with: rest and medication Relief from Meds: 7  History reviewed. No pertinent family history. Social History   Socioeconomic History  . Marital status: Single    Spouse name: Not on file  . Number of children: Not on file  . Years of education: Not on file  . Highest education level: Not on file  Occupational History  . Not on file  Tobacco Use  . Smoking status: Former Smoker    Packs/day: 0.50    Years: 5.00    Pack years: 2.50    Types: Cigarettes    Quit date: 04/16/2018    Years since quitting: 2.2  . Smokeless tobacco: Former Clinical biochemist  . Vaping Use: Former  . Quit date: 04/16/2018  . Substances: CBD  Substance and  Sexual Activity  . Alcohol use: No  . Drug use: No  . Sexual activity: Not Currently  Other Topics Concern  . Not on file  Social History Narrative  . Not on file   Social Determinants of Health   Financial Resource Strain:   . Difficulty of Paying Living Expenses: Not on file  Food Insecurity:   . Worried About Programme researcher, broadcasting/film/video in the Last Year: Not on file  . Ran Out of Food in the Last Year: Not on file  Transportation Needs:   . Lack of Transportation (Medical): Not on file  . Lack of Transportation (Non-Medical): Not on file  Physical Activity:   . Days of Exercise per Week: Not on file  . Minutes of Exercise per Session: Not on file  Stress:   . Feeling of Stress : Not on file  Social Connections:   . Frequency of Communication with Friends and Family: Not on file  . Frequency of Social Gatherings with Friends and Family: Not on file  . Attends Religious Services: Not on file  . Active Member of Clubs or Organizations: Not on file  . Attends Banker Meetings: Not on file  . Marital Status: Not on file   Past Surgical History:  Procedure Laterality Date  .  broken clavicles  04/16/2018  . broken jaw  04/16/2018  . broken right heel  04/16/2018   Past Surgical History:  Procedure Laterality Date  . broken clavicles  04/16/2018  . broken jaw  04/16/2018  . broken right heel  04/16/2018   Past Medical History:  Diagnosis Date  . Brain injury (HCC)   . G tube feedings (HCC)   . PE (pulmonary thromboembolism) (HCC) 05/2018   BP 120/83   Pulse 94   Temp 98.1 F (36.7 C)   SpO2 97%   Opioid Risk Score:   Fall Risk Score:  `1  Depression screen PHQ 2/9  Depression screen [facility] 2/9 05/07/2020 03/05/2020 01/16/2020  Decreased Interest 1 0 1  Down, Depressed, Hopeless 1 0 1  PHQ - 2 Score 2 0 2  Altered sleeping - 0 -  Tired, decreased energy - 0 -  Change in appetite - 0 -  Feeling bad or failure about yourself  - 0 -  Trouble concentrating -  0 -  Moving slowly or fidgety/restless - 0 -  Suicidal thoughts - 0 -  PHQ-9 Score - 0 -    Review of Systems  Constitutional: Negative.   HENT: Negative.   Eyes: Negative.   Respiratory: Negative.   Endocrine: Negative.   Genitourinary: Negative.   Musculoskeletal: Negative.        Spasticity  Skin: Negative.   Allergic/Immunologic: Negative.   Neurological: Negative.   Hematological: Negative.   Psychiatric/Behavioral: Negative.   All other systems reviewed and are negative.      Objective:   Physical Exam  General: No acute distress HEENT: EOMI, oral membranes moist Cards: reg rate  Chest: normal effort Abdomen: Soft, NT, ND Skin: dry, intact Extremities: no edema Neuro: pt alert follows simple commands. Still impulsive. Left arm in flexor position with contracture. Moves right arm and both legs spontaneously.   Musc: able to passively range left elbow into about 30 degrees extension. Left foream/wrist in flexion and pronation. Tends to have head forward posture. Psych:  calm and failry alert.      Assessment & Plan:  1.  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 3.  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 4.  Chronic pain wsyndrome related to the above.  This is primarily related to his spasticity but there may be a neurogenic component as well.  Also, from a behavioral standpoint he perseverates on pain and has poor impulse control as related to his brain injury 5.  Insomnia related to the above 6.  History of DVT on Xarelto     Plan: 1. Continue Depakote to 250mg  AM and 500mg  PM daily.. 3.  Will not resume ritalin at this point  4.  Baclofen has been effective for tone.  Continue at 20mg  qid for now    -increase dantrium to 50mg  QID.   5. Continue outpt PT/OT for mobility, ROM  -working on new custom w/c 6.  May stay with Celebrex and Tylenol as they are doing at home. prn 7.   Consider surgical input for left hand and wrist--would like another round of botox first 8.    repeat botox, 500u LUE.      15 minutes was spent with the patient in direct contact and consultation today.  I will see him back  in about  1 month for Botox

## 2020-07-28 ENCOUNTER — Ambulatory Visit: Payer: BC Managed Care – PPO | Attending: Internal Medicine | Admitting: Occupational Therapy

## 2020-07-28 ENCOUNTER — Other Ambulatory Visit: Payer: Self-pay

## 2020-07-28 DIAGNOSIS — R293 Abnormal posture: Secondary | ICD-10-CM | POA: Diagnosis present

## 2020-07-28 DIAGNOSIS — G8114 Spastic hemiplegia affecting left nondominant side: Secondary | ICD-10-CM | POA: Insufficient documentation
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
## 2020-08-06 ENCOUNTER — Other Ambulatory Visit: Payer: Self-pay

## 2020-08-06 DIAGNOSIS — G890 Central pain syndrome: Secondary | ICD-10-CM

## 2020-08-06 DIAGNOSIS — F0630 Mood disorder due to known physiological condition, unspecified: Secondary | ICD-10-CM

## 2020-08-06 DIAGNOSIS — S069X9S Unspecified intracranial injury with loss of consciousness of unspecified duration, sequela: Secondary | ICD-10-CM

## 2020-08-06 MED ORDER — QUETIAPINE FUMARATE 50 MG PO TABS: ORAL_TABLET | ORAL | 0 refills | Status: DC

## 2020-08-06 MED ORDER — DIVALPROEX SODIUM 500 MG PO DR TAB: 500.0000 mg | DELAYED_RELEASE_TABLET | Freq: Two times a day (BID) | ORAL | 0 refills | Status: DC

## 2020-08-06 MED ORDER — CITALOPRAM HYDROBROMIDE 20 MG PO TABS: 20.0000 mg | ORAL_TABLET | Freq: Every day | ORAL | 0 refills | Status: DC

## 2020-08-12 ENCOUNTER — Ambulatory Visit: Payer: BC Managed Care – PPO | Admitting: Occupational Therapy

## 2020-08-13 ENCOUNTER — Encounter: Payer: Self-pay | Admitting: Physical Medicine & Rehabilitation

## 2020-08-13 ENCOUNTER — Encounter: Payer: BC Managed Care – PPO | Attending: Psychology | Admitting: Physical Medicine & Rehabilitation

## 2020-08-13 ENCOUNTER — Ambulatory Visit: Payer: BC Managed Care – PPO | Admitting: Occupational Therapy

## 2020-08-13 ENCOUNTER — Other Ambulatory Visit: Payer: Self-pay

## 2020-08-13 VITALS — BP 124/79 | HR 95 | Temp 98.3°F

## 2020-08-13 DIAGNOSIS — G8114 Spastic hemiplegia affecting left nondominant side: Secondary | ICD-10-CM | POA: Insufficient documentation

## 2020-08-13 NOTE — Progress Notes (Signed)
Botox Injection for spasticity of upper extremity using needle EMG guidance Indication: Spastic hemiparesis of left nondominant side (HCC) G81.14  Dilution: 100 Units/ml        Total Units Injected: 500 Indication: Severe spasticity which interferes with ADL,mobility and/or  hygiene and is unresponsive to medication management and other conservative care Informed consent was obtained after describing risks and benefits of the procedure with the patient. This includes bleeding, bruising, infection, excessive weakness, or medication side effects. A REMS form is on file and signed.  Needle: 1.5" 25g needle    Number of units per muscle Pectoralis Major 0 units Pectoralis Minor 0 units Biceps 350 units with 4 access points Brachioradialis 150 units two access points FCR 0 units FCU 0 units FDS 0 units FDP 0 units FPL 0 units Palmaris Longus 0 units Pronator Teres 0 units Pronator Quadratus 0 units Lumbricals 0 units All injections were done after negative drawback for blood. The patient tolerated the procedure well. Post procedure instructions were given. Return in about 3 months (around 11/13/2020).

## 2020-08-13 NOTE — Patient Instructions (Signed)
PLEASE FEEL FREE TO CALL OUR OFFICE WITH ANY PROBLEMS OR QUESTIONS ([phone])      

## 2020-08-18 ENCOUNTER — Ambulatory Visit: Payer: BC Managed Care – PPO | Admitting: Occupational Therapy

## 2020-08-18 ENCOUNTER — Other Ambulatory Visit: Payer: Self-pay

## 2020-08-18 DIAGNOSIS — G8114 Spastic hemiplegia affecting left nondominant side: Secondary | ICD-10-CM | POA: Diagnosis not present

## 2020-08-18 DIAGNOSIS — R293 Abnormal posture: Secondary | ICD-10-CM
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
## 2020-08-26 ENCOUNTER — Encounter: Payer: Self-pay | Admitting: Occupational Therapy

## 2020-08-26 ENCOUNTER — Ambulatory Visit: Payer: BC Managed Care – PPO | Attending: Internal Medicine | Admitting: Occupational Therapy

## 2020-08-26 ENCOUNTER — Other Ambulatory Visit: Payer: Self-pay

## 2020-08-26 DIAGNOSIS — R293 Abnormal posture: Secondary | ICD-10-CM | POA: Diagnosis present

## 2020-08-26 DIAGNOSIS — G8114 Spastic hemiplegia affecting left nondominant side: Secondary | ICD-10-CM

## 2020-08-26 DIAGNOSIS — R252 Cramp and spasm: Secondary | ICD-10-CM | POA: Diagnosis present
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
## 2020-08-27 ENCOUNTER — Other Ambulatory Visit: Payer: Self-pay

## 2020-08-27 DIAGNOSIS — F0630 Mood disorder due to known physiological condition, unspecified: Secondary | ICD-10-CM

## 2020-08-27 DIAGNOSIS — S069X9S Unspecified intracranial injury with loss of consciousness of unspecified duration, sequela: Secondary | ICD-10-CM

## 2020-08-27 DIAGNOSIS — S069XAS Unspecified intracranial injury with loss of consciousness status unknown, sequela: Secondary | ICD-10-CM

## 2020-08-28 NOTE — Telephone Encounter (Signed)
Refill request for Hydrocodone 10/325.  PMP states last filled on 07/01/2020 & last written on 07/01/2020

## 2020-08-29 MED ORDER — CITALOPRAM HYDROBROMIDE 20 MG PO TABS: 20.0000 mg | ORAL_TABLET | Freq: Every day | ORAL | 5 refills | Status: DC

## 2020-08-29 MED ORDER — HYDROCODONE-ACETAMINOPHEN 10-325 MG PO TABS: 1.0000 | ORAL_TABLET | Freq: Four times a day (QID) | ORAL | 0 refills | Status: DC | PRN

## 2020-09-02 ENCOUNTER — Ambulatory Visit: Payer: BC Managed Care – PPO | Admitting: Occupational Therapy

## 2020-09-02 ENCOUNTER — Encounter: Payer: Self-pay | Admitting: Occupational Therapy

## 2020-09-02 ENCOUNTER — Other Ambulatory Visit: Payer: Self-pay

## 2020-09-02 DIAGNOSIS — G8114 Spastic hemiplegia affecting left nondominant side: Secondary | ICD-10-CM | POA: Diagnosis not present

## 2020-09-02 DIAGNOSIS — R293 Abnormal posture: Secondary | ICD-10-CM

## 2020-09-02 DIAGNOSIS — R252 Cramp and spasm: Secondary | ICD-10-CM
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
## 2020-09-09 ENCOUNTER — Ambulatory Visit: Payer: BC Managed Care – PPO | Admitting: Occupational Therapy

## 2020-09-09 ENCOUNTER — Other Ambulatory Visit: Payer: Self-pay

## 2020-09-09 ENCOUNTER — Encounter: Payer: Self-pay | Admitting: Occupational Therapy

## 2020-09-09 DIAGNOSIS — R293 Abnormal posture: Secondary | ICD-10-CM

## 2020-09-09 DIAGNOSIS — G8114 Spastic hemiplegia affecting left nondominant side: Secondary | ICD-10-CM | POA: Diagnosis not present
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
## 2020-09-09 NOTE — Patient Instructions (Signed)
   Aquatic Therapy: What to Expect!  Where:  [facility]           NOTE: You will receive an automated phone message [address] [facility]           reminding you of your appointment and it will say the  [place], [place]  [address]           appointment is at the [facility] on [address] We are  [phone]             working to fix this - just know that you will meet [place] at                pool!   How to Prepare: . Please make sure you drink 8 ounces of water about one hour prior to your pool session . A caregiver must attend the entire session with the patient (unless your primary therapists feels this is not necessary). The caregiver will be responsible for assisting with dressing as well as any toileting needs.  . Please arrive IN YOUR SUIT and a few minutes prior to your appointment - this helps to avoid delays in starting your session. . Please make sure to attend to any toileting needs prior to entering the pool . Once on the pool deck your therapist will ask you to sign the Patient  Consent and Assignment of Benefits form . Your therapist may take your blood pressure prior to, during and after your session if indicated . We usually try and create a home exercise program based on activities we do in the pool.  Please be thinking about who might be able to assist you in the pool should you want to participate in an aquatic home exercise program at the time of discharge.  Some patients do not want to or do not have the ability to participate in an aquatic home program - this is not a barrier in any way to you participating in aquatic therapy as part of your current therapy plan!    About the pool: 1. Entering the pool Your therapist will assist you; there are multiple ways to enter including stairs with railings, a walk in ramp, a roll in chair and a mechanical lift. Your therapist will determine the most appropriate way for you. 2. Water temperature is usually between 86-87  degrees 3. There may be other swimmers in the pool at the same time     Contact Info:             Appointments: [facility]         All sessions are 45 minutes   [address]  [address]            Please call the [facility] if   [place], [place]  [address]           you need to cancel or reschedule an appointment.  [phone]           Kerry Fort, PT    Mackie Pai, OTR/L    03/12/20

## 2020-09-15 ENCOUNTER — Ambulatory Visit: Payer: BC Managed Care – PPO | Admitting: Occupational Therapy

## 2020-09-15 ENCOUNTER — Encounter: Payer: Self-pay | Admitting: Occupational Therapy

## 2020-09-15 DIAGNOSIS — G8114 Spastic hemiplegia affecting left nondominant side: Secondary | ICD-10-CM | POA: Diagnosis not present
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
## 2020-09-16 ENCOUNTER — Ambulatory Visit: Payer: BC Managed Care – PPO | Admitting: Occupational Therapy

## 2020-09-24 NOTE — Addendum Note (Signed)
Addended by: Collier Salina on: 09/24/2020 05:15 PM   Modules accepted: Orders

## 2020-09-25 ENCOUNTER — Other Ambulatory Visit: Payer: Self-pay

## 2020-09-25 DIAGNOSIS — S069X9S Unspecified intracranial injury with loss of consciousness of unspecified duration, sequela: Secondary | ICD-10-CM

## 2020-09-25 DIAGNOSIS — S069XAS Unspecified intracranial injury with loss of consciousness status unknown, sequela: Secondary | ICD-10-CM

## 2020-09-25 DIAGNOSIS — F0630 Mood disorder due to known physiological condition, unspecified: Secondary | ICD-10-CM

## 2020-09-25 DIAGNOSIS — G890 Central pain syndrome: Secondary | ICD-10-CM

## 2020-09-26 MED ORDER — QUETIAPINE FUMARATE 50 MG PO TABS: ORAL_TABLET | ORAL | 0 refills | Status: DC

## 2020-09-26 MED ORDER — DIVALPROEX SODIUM 500 MG PO DR TAB: 500.0000 mg | DELAYED_RELEASE_TABLET | Freq: Two times a day (BID) | ORAL | 0 refills | Status: DC

## 2020-09-29 ENCOUNTER — Ambulatory Visit: Payer: BC Managed Care – PPO | Attending: Internal Medicine | Admitting: Occupational Therapy

## 2020-10-09 ENCOUNTER — Other Ambulatory Visit: Payer: Self-pay

## 2020-10-09 MED ORDER — HYDROCODONE-ACETAMINOPHEN 10-325 MG PO TABS: 1.0000 | ORAL_TABLET | Freq: Four times a day (QID) | ORAL | 0 refills | Status: DC | PRN

## 2020-11-04 ENCOUNTER — Other Ambulatory Visit: Payer: Self-pay

## 2020-11-04 DIAGNOSIS — F0630 Mood disorder due to known physiological condition, unspecified: Secondary | ICD-10-CM

## 2020-11-04 DIAGNOSIS — G890 Central pain syndrome: Secondary | ICD-10-CM

## 2020-11-04 DIAGNOSIS — S069X9S Unspecified intracranial injury with loss of consciousness of unspecified duration, sequela: Secondary | ICD-10-CM

## 2020-11-05 MED ORDER — QUETIAPINE FUMARATE 50 MG PO TABS: ORAL_TABLET | ORAL | 0 refills | Status: DC

## 2020-11-05 MED ORDER — DIVALPROEX SODIUM 500 MG PO DR TAB: 500.0000 mg | DELAYED_RELEASE_TABLET | Freq: Two times a day (BID) | ORAL | 0 refills | Status: DC

## 2020-11-17 ENCOUNTER — Other Ambulatory Visit: Payer: Self-pay | Admitting: Physical Medicine & Rehabilitation

## 2020-11-17 DIAGNOSIS — R252 Cramp and spasm: Secondary | ICD-10-CM

## 2020-11-18 ENCOUNTER — Ambulatory Visit: Payer: BC Managed Care – PPO | Attending: Internal Medicine | Admitting: Occupational Therapy

## 2020-11-18 ENCOUNTER — Encounter: Payer: Self-pay | Admitting: Occupational Therapy

## 2020-11-18 ENCOUNTER — Other Ambulatory Visit: Payer: Self-pay

## 2020-11-18 ENCOUNTER — Other Ambulatory Visit (HOSPITAL_COMMUNITY): Payer: Self-pay | Admitting: Physical Medicine & Rehabilitation

## 2020-11-18 DIAGNOSIS — R2681 Unsteadiness on feet: Secondary | ICD-10-CM | POA: Diagnosis present

## 2020-11-18 DIAGNOSIS — R252 Cramp and spasm: Secondary | ICD-10-CM | POA: Diagnosis present

## 2020-11-18 DIAGNOSIS — G8114 Spastic hemiplegia affecting left nondominant side: Secondary | ICD-10-CM | POA: Diagnosis not present

## 2020-11-18 DIAGNOSIS — G8250 Quadriplegia, unspecified: Secondary | ICD-10-CM | POA: Diagnosis present

## 2020-11-18 DIAGNOSIS — R293 Abnormal posture: Secondary | ICD-10-CM | POA: Diagnosis present
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
## 2020-11-19 ENCOUNTER — Encounter: Payer: Self-pay | Admitting: Physical Medicine & Rehabilitation

## 2020-11-19 ENCOUNTER — Encounter
Payer: BC Managed Care – PPO | Attending: Physical Medicine & Rehabilitation | Admitting: Physical Medicine & Rehabilitation

## 2020-11-19 ENCOUNTER — Other Ambulatory Visit: Payer: Self-pay

## 2020-11-19 VITALS — BP 127/80 | HR 77 | Temp 98.6°F

## 2020-11-19 DIAGNOSIS — Z5181 Encounter for therapeutic drug level monitoring: Secondary | ICD-10-CM | POA: Diagnosis present

## 2020-11-19 DIAGNOSIS — S069XAS Unspecified intracranial injury with loss of consciousness status unknown, sequela: Secondary | ICD-10-CM

## 2020-11-19 DIAGNOSIS — G8114 Spastic hemiplegia affecting left nondominant side: Secondary | ICD-10-CM | POA: Insufficient documentation

## 2020-11-19 DIAGNOSIS — A499 Bacterial infection, unspecified: Secondary | ICD-10-CM | POA: Insufficient documentation

## 2020-11-19 DIAGNOSIS — S069X9S Unspecified intracranial injury with loss of consciousness of unspecified duration, sequela: Secondary | ICD-10-CM | POA: Diagnosis present

## 2020-11-19 DIAGNOSIS — F0630 Mood disorder due to known physiological condition, unspecified: Secondary | ICD-10-CM | POA: Diagnosis present

## 2020-11-19 DIAGNOSIS — G894 Chronic pain syndrome: Secondary | ICD-10-CM | POA: Diagnosis present

## 2020-11-19 DIAGNOSIS — S062X4S Diffuse traumatic brain injury with loss of consciousness of 6 hours to 24 hours, sequela: Secondary | ICD-10-CM

## 2020-11-19 DIAGNOSIS — Z79891 Long term (current) use of opiate analgesic: Secondary | ICD-10-CM | POA: Diagnosis not present

## 2020-11-19 DIAGNOSIS — N390 Urinary tract infection, site not specified: Secondary | ICD-10-CM | POA: Insufficient documentation

## 2020-11-19 MED ORDER — HYDROCODONE-ACETAMINOPHEN 10-325 MG PO TABS: 1.0000 | ORAL_TABLET | Freq: Four times a day (QID) | ORAL | 0 refills | Status: DC | PRN

## 2020-11-19 NOTE — Patient Instructions (Signed)
PLEASE FEEL FREE TO CALL OUR OFFICE WITH ANY PROBLEMS OR QUESTIONS ([phone])      
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
## 2020-11-20 ENCOUNTER — Telehealth: Payer: Self-pay | Admitting: *Deleted

## 2020-11-20 IMAGING — DX DG ABDOMEN 1V
2 series · 2 of 2 positions shown · non-contrast
Comparison: 08/16/2018

CLINICAL DATA: Constipation

EXAM:
ABDOMEN - 1 VIEW

[abdomen kub (1 of 2)]
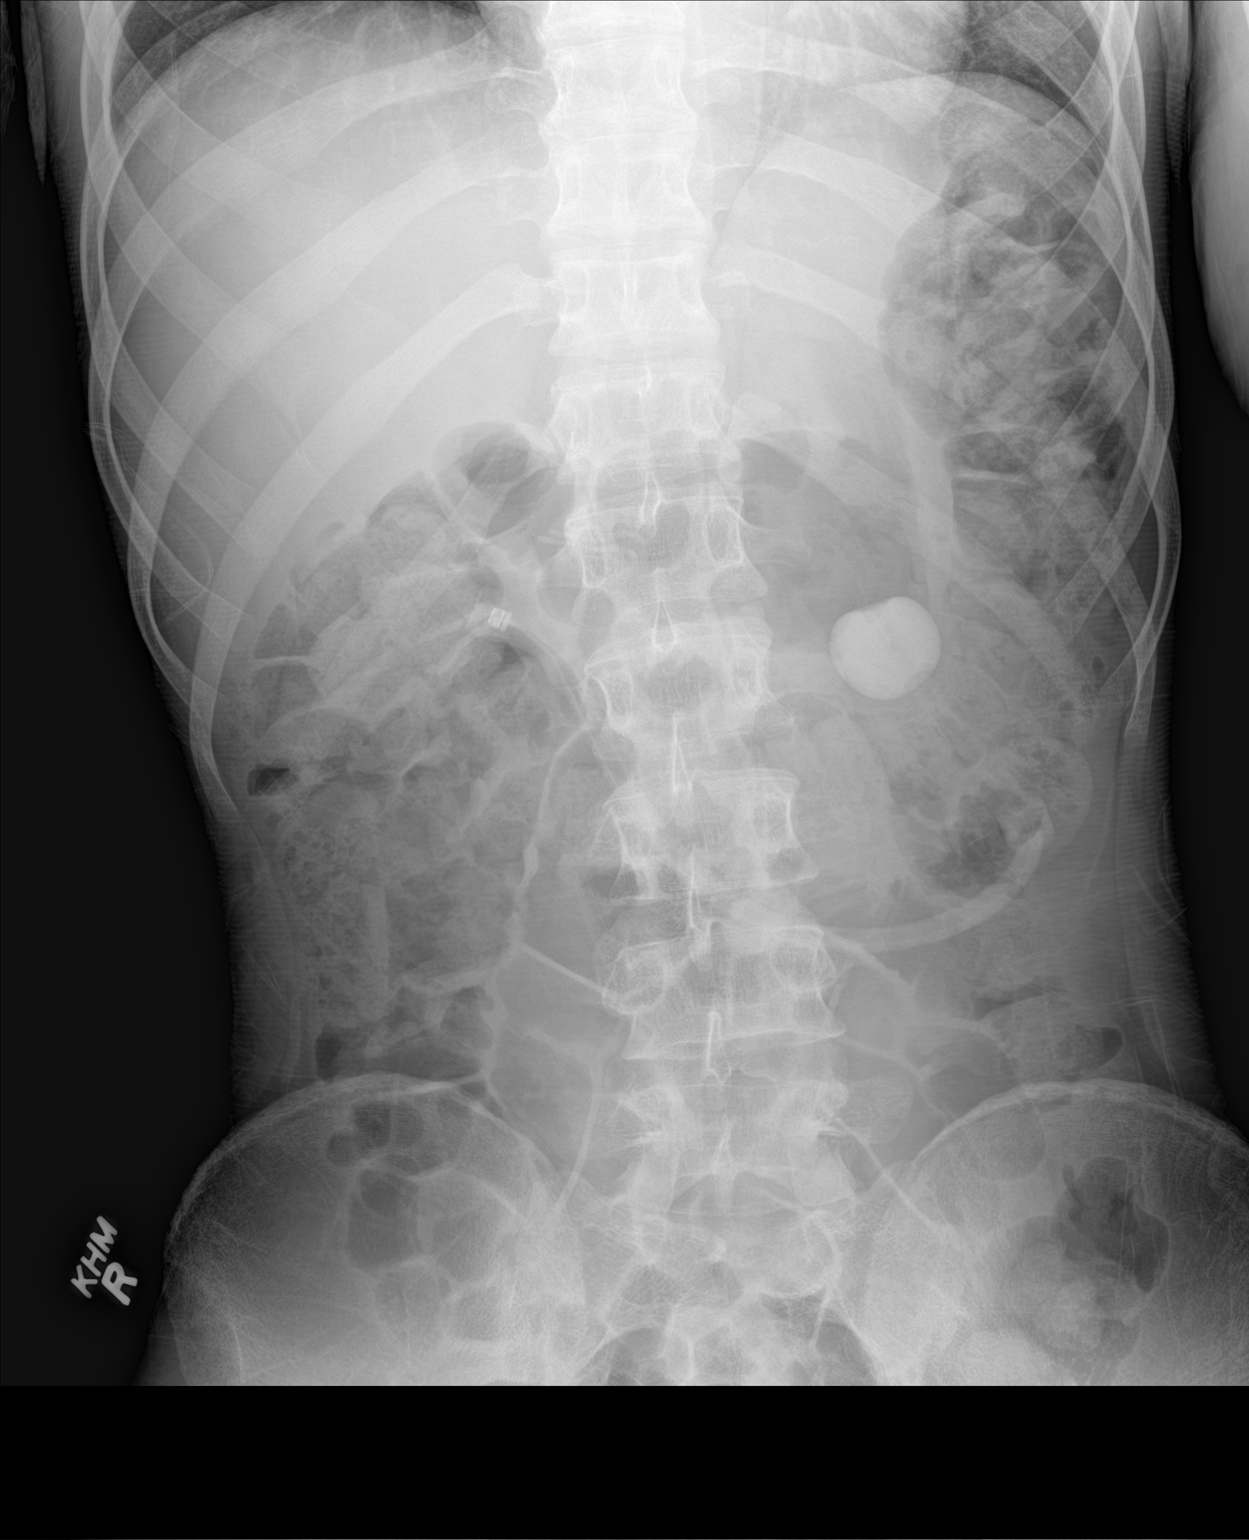

[abdomen kub (2 of 2)]
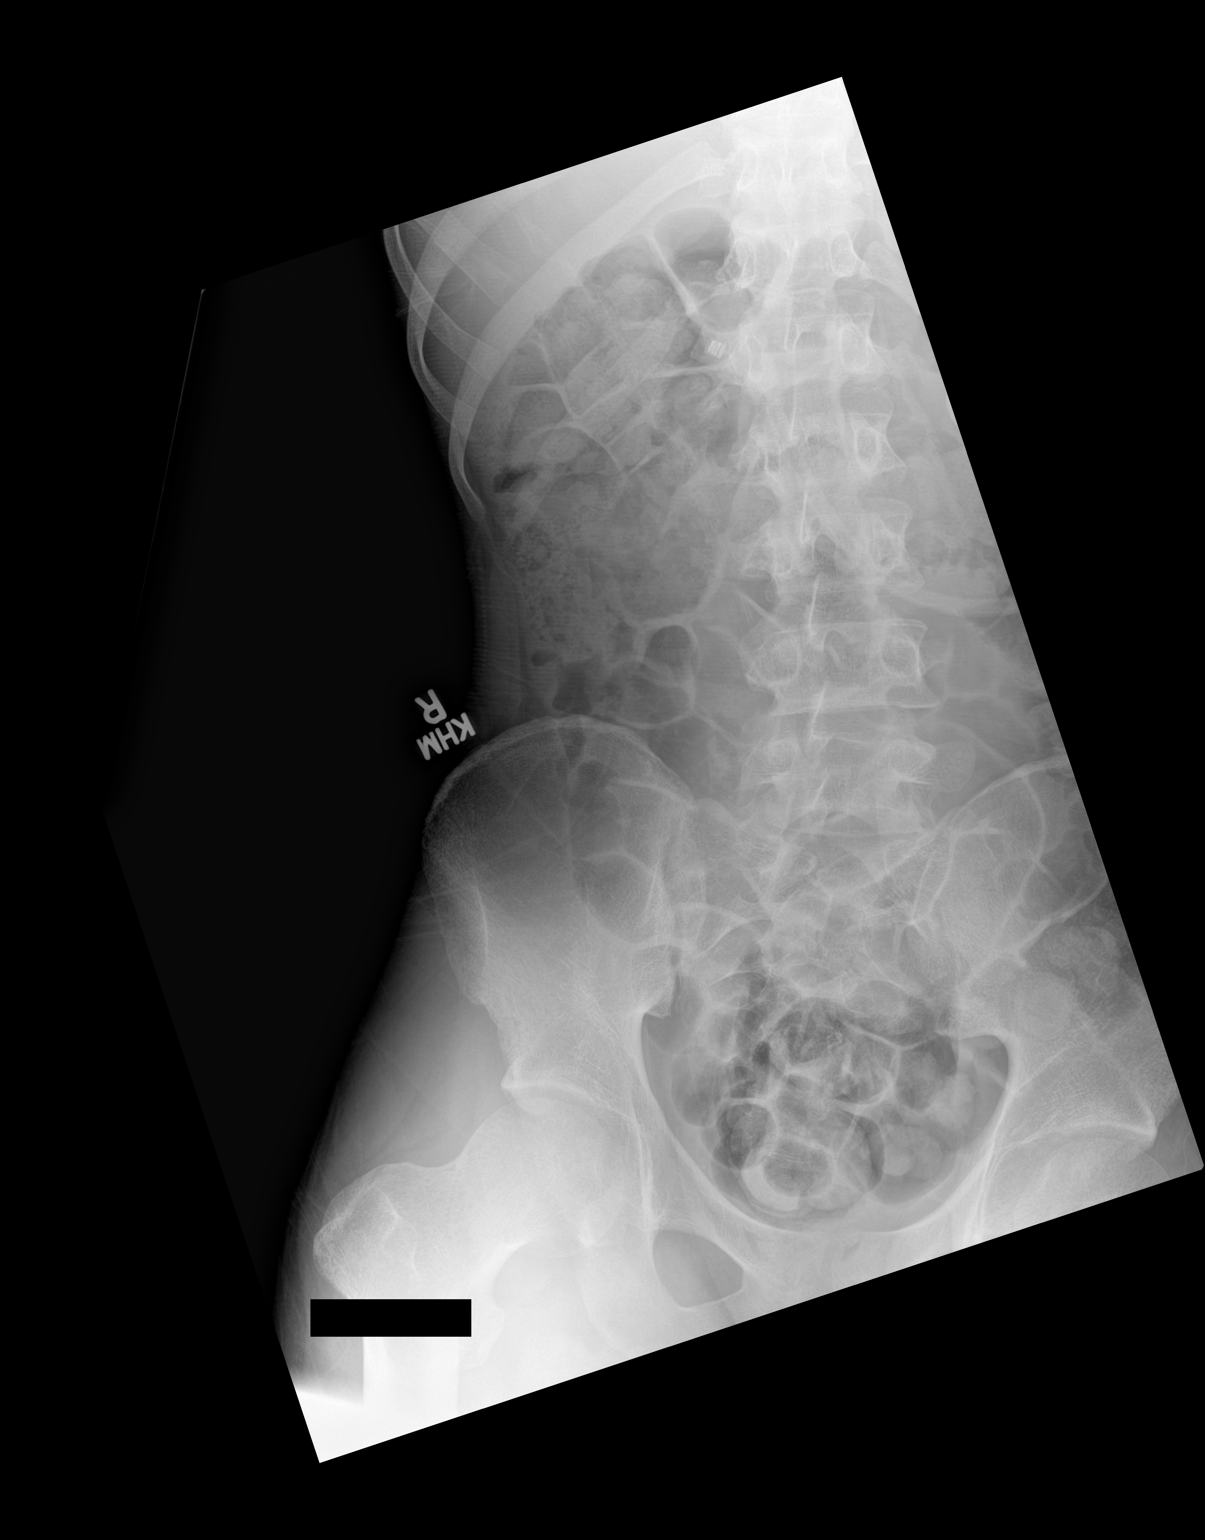

[2 of 2 positions shown; findings below may reference images not displayed]

FINDINGS: Gastrostomy catheter is again noted in place. Mild retained fecal
material is seen although slight decrease from the prior exam. No
free air is noted. No bony abnormality is seen.
IMPRESSION: Retained fecal material although somewhat improved from the prior
study.

## 2020-11-20 NOTE — Telephone Encounter (Signed)
Quetiapine is APPROVED through 11/13/2021

## 2020-11-23 LAB — DRUG TOX MONITOR 1 W/CONF, ORAL FLD
Amphetamines: NEGATIVE ng/mL (ref <10)
Barbiturates: NEGATIVE ng/mL (ref <10)
Benzodiazepines: NEGATIVE ng/mL (ref <0.50)
Buprenorphine: NEGATIVE ng/mL (ref <0.10)
Cocaine: NEGATIVE ng/mL (ref <5.0)
Codeine: NEGATIVE ng/mL (ref <2.5)
Dihydrocodeine: 5.1 ng/mL — ABNORMAL HIGH (ref <2.5)
Fentanyl: NEGATIVE ng/mL (ref <0.10)
Heroin Metabolite: NEGATIVE ng/mL (ref <1.0)
Hydrocodone: 70.1 ng/mL — ABNORMAL HIGH (ref <2.5)
Hydromorphone: NEGATIVE ng/mL (ref <2.5)
MARIJUANA: POSITIVE ng/mL — AB (ref <2.5)
MDMA: NEGATIVE ng/mL (ref <10)
Meprobamate: NEGATIVE ng/mL (ref <2.5)
Methadone: NEGATIVE ng/mL (ref <5.0)
Morphine: NEGATIVE ng/mL (ref <2.5)
Nicotine Metabolite: NEGATIVE ng/mL (ref <5.0)
Norhydrocodone: 3.6 ng/mL — ABNORMAL HIGH (ref <2.5)
Noroxycodone: NEGATIVE ng/mL (ref <2.5)
Opiates: POSITIVE ng/mL — AB (ref <2.5)
Oxycodone: NEGATIVE ng/mL (ref <2.5)
Oxymorphone: NEGATIVE ng/mL (ref <2.5)
Phencyclidine: NEGATIVE ng/mL (ref <10)
THC: 2.6 ng/mL — ABNORMAL HIGH (ref <2.5)
Tapentadol: NEGATIVE ng/mL (ref <5.0)
Tramadol: NEGATIVE ng/mL (ref <5.0)
Zolpidem: NEGATIVE ng/mL (ref <5.0)

## 2020-11-23 LAB — DRUG TOX ALC METAB W/CON, ORAL FLD: Alcohol Metabolite: NEGATIVE ng/mL (ref <25)

## 2020-11-25 ENCOUNTER — Ambulatory Visit: Payer: BC Managed Care – PPO

## 2020-11-25 ENCOUNTER — Other Ambulatory Visit: Payer: Self-pay

## 2020-11-25 ENCOUNTER — Encounter: Payer: Self-pay | Admitting: Occupational Therapy

## 2020-11-25 ENCOUNTER — Telehealth: Payer: Self-pay | Admitting: *Deleted

## 2020-11-25 ENCOUNTER — Ambulatory Visit: Payer: BC Managed Care – PPO | Admitting: Occupational Therapy

## 2020-11-25 DIAGNOSIS — R293 Abnormal posture: Secondary | ICD-10-CM

## 2020-11-25 DIAGNOSIS — G8114 Spastic hemiplegia affecting left nondominant side: Secondary | ICD-10-CM | POA: Diagnosis not present

## 2020-11-25 DIAGNOSIS — R2681 Unsteadiness on feet: Secondary | ICD-10-CM

## 2020-11-25 NOTE — Telephone Encounter (Signed)
Oral drug screen was positive for his prescribed hydrocodone, but it is also positive for marijuana.
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
## 2020-11-26 LAB — URINALYSIS
Bilirubin, UA: NEGATIVE
Glucose, UA: NEGATIVE
Ketones, UA: NEGATIVE
Leukocytes,UA: NEGATIVE
Nitrite, UA: NEGATIVE
Protein,UA: NEGATIVE
RBC, UA: NEGATIVE
Specific Gravity, UA: 1.023 (ref 1.005–1.030)
Urobilinogen, Ur: 0.2 mg/dL (ref 0.2–1.0)
pH, UA: 8 — ABNORMAL HIGH (ref 5.0–7.5)
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
## 2020-11-30 LAB — URINE CULTURE

## 2020-12-01 NOTE — Telephone Encounter (Signed)
Called mom. No answer. Forwarding this to myself, as well, to remind me to call again tomorrow

## 2020-12-02 ENCOUNTER — Ambulatory Visit: Payer: BC Managed Care – PPO | Admitting: Occupational Therapy

## 2020-12-02 ENCOUNTER — Encounter: Payer: Self-pay | Admitting: Occupational Therapy

## 2020-12-02 ENCOUNTER — Ambulatory Visit: Payer: BC Managed Care – PPO

## 2020-12-02 ENCOUNTER — Telehealth: Payer: Self-pay

## 2020-12-02 DIAGNOSIS — R2681 Unsteadiness on feet: Secondary | ICD-10-CM

## 2020-12-02 DIAGNOSIS — G8114 Spastic hemiplegia affecting left nondominant side: Secondary | ICD-10-CM

## 2020-12-02 DIAGNOSIS — R293 Abnormal posture: Secondary | ICD-10-CM

## 2020-12-02 MED ORDER — SULFAMETHOXAZOLE-TRIMETHOPRIM 800-160 MG PO TABS
1.0000 | ORAL_TABLET | Freq: Two times a day (BID) | ORAL | 0 refills | Status: DC
Start: 2020-12-02 — End: 2021-04-23

## 2020-12-02 NOTE — Telephone Encounter (Signed)
Just as an FYI, I tried to contact mom twice yesterday. Was able to speak to her today. Will send in rx for bactrim ds.  Small THC reading probably came from CBD "gummy bears" grandmother had given him. They are no longer giving these to him.

## 2020-12-02 NOTE — Telephone Encounter (Signed)
Patient mom called stating that results from urine test states patient has an UTI and is in a lot of pain. Requesting antibiotic.
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
## 2020-12-05 ENCOUNTER — Other Ambulatory Visit: Payer: Self-pay

## 2020-12-05 ENCOUNTER — Ambulatory Visit: Payer: BC Managed Care – PPO

## 2020-12-05 DIAGNOSIS — G8114 Spastic hemiplegia affecting left nondominant side: Secondary | ICD-10-CM | POA: Diagnosis not present

## 2020-12-05 DIAGNOSIS — R2681 Unsteadiness on feet: Secondary | ICD-10-CM
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
## 2020-12-06 NOTE — Patient Instructions (Signed)
Applied, adjusted and instructed CGs in donning of night splint, questions answered

## 2020-12-08 ENCOUNTER — Other Ambulatory Visit: Payer: Self-pay

## 2020-12-08 DIAGNOSIS — F0630 Mood disorder due to known physiological condition, unspecified: Secondary | ICD-10-CM

## 2020-12-08 DIAGNOSIS — G890 Central pain syndrome: Secondary | ICD-10-CM

## 2020-12-08 DIAGNOSIS — S069X9S Unspecified intracranial injury with loss of consciousness of unspecified duration, sequela: Secondary | ICD-10-CM

## 2020-12-09 ENCOUNTER — Other Ambulatory Visit: Payer: Self-pay

## 2020-12-09 ENCOUNTER — Ambulatory Visit: Payer: BC Managed Care – PPO | Admitting: Occupational Therapy

## 2020-12-09 ENCOUNTER — Encounter: Payer: Self-pay | Admitting: Occupational Therapy

## 2020-12-09 ENCOUNTER — Ambulatory Visit: Payer: BC Managed Care – PPO

## 2020-12-09 DIAGNOSIS — G8114 Spastic hemiplegia affecting left nondominant side: Secondary | ICD-10-CM

## 2020-12-09 DIAGNOSIS — R293 Abnormal posture: Secondary | ICD-10-CM

## 2020-12-09 DIAGNOSIS — R2681 Unsteadiness on feet: Secondary | ICD-10-CM

## 2020-12-09 DIAGNOSIS — G8250 Quadriplegia, unspecified: Secondary | ICD-10-CM

## 2020-12-09 MED ORDER — QUETIAPINE FUMARATE 50 MG PO TABS: ORAL_TABLET | ORAL | 0 refills | Status: DC

## 2020-12-09 MED ORDER — DIVALPROEX SODIUM 500 MG PO DR TAB: 500.0000 mg | DELAYED_RELEASE_TABLET | Freq: Two times a day (BID) | ORAL | 0 refills | Status: DC
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
## 2020-12-10 NOTE — Patient Instructions (Signed)
Use foam or gel lumbar support to restore lordosis

## 2020-12-16 ENCOUNTER — Ambulatory Visit: Payer: BC Managed Care – PPO | Admitting: Occupational Therapy

## 2020-12-16 ENCOUNTER — Ambulatory Visit: Payer: BC Managed Care – PPO

## 2020-12-23 ENCOUNTER — Ambulatory Visit: Payer: BC Managed Care – PPO | Admitting: Occupational Therapy

## 2020-12-25 ENCOUNTER — Ambulatory Visit: Payer: BC Managed Care – PPO | Attending: Internal Medicine | Admitting: Physical Therapy

## 2020-12-30 ENCOUNTER — Ambulatory Visit: Payer: BC Managed Care – PPO | Admitting: Occupational Therapy

## 2020-12-30 ENCOUNTER — Ambulatory Visit: Payer: BC Managed Care – PPO

## 2021-01-06 ENCOUNTER — Ambulatory Visit: Payer: BC Managed Care – PPO

## 2021-01-06 ENCOUNTER — Ambulatory Visit: Payer: BC Managed Care – PPO | Admitting: Occupational Therapy

## 2021-01-06 ENCOUNTER — Other Ambulatory Visit: Payer: Self-pay

## 2021-01-06 DIAGNOSIS — S062X4S Diffuse traumatic brain injury with loss of consciousness of 6 hours to 24 hours, sequela: Secondary | ICD-10-CM

## 2021-01-06 DIAGNOSIS — G8114 Spastic hemiplegia affecting left nondominant side: Secondary | ICD-10-CM

## 2021-01-07 MED ORDER — HYDROCODONE-ACETAMINOPHEN 10-325 MG PO TABS: 1.0000 | ORAL_TABLET | Freq: Four times a day (QID) | ORAL | 0 refills | Status: DC | PRN

## 2021-01-13 ENCOUNTER — Ambulatory Visit: Payer: BC Managed Care – PPO | Admitting: Occupational Therapy

## 2021-01-13 ENCOUNTER — Ambulatory Visit: Payer: BC Managed Care – PPO

## 2021-01-20 ENCOUNTER — Ambulatory Visit: Payer: BC Managed Care – PPO

## 2021-01-21 ENCOUNTER — Other Ambulatory Visit: Payer: Self-pay

## 2021-01-21 ENCOUNTER — Encounter
Payer: BC Managed Care – PPO | Attending: Physical Medicine & Rehabilitation | Admitting: Physical Medicine & Rehabilitation

## 2021-01-21 ENCOUNTER — Encounter: Payer: Self-pay | Admitting: Physical Medicine & Rehabilitation

## 2021-01-21 VITALS — BP 128/81 | HR 82 | Temp 98.2°F

## 2021-01-21 DIAGNOSIS — G8114 Spastic hemiplegia affecting left nondominant side: Secondary | ICD-10-CM

## 2021-01-21 MED ORDER — XARELTO 10 MG PO TABS: 10.0000 mg | ORAL_TABLET | Freq: Every day | ORAL | 6 refills | Status: DC

## 2021-01-21 NOTE — Progress Notes (Signed)
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 This includes bleeding, bruising, infection, excessive weakness, or medication side effects. A REMS form is on file and signed.  Needle: 31mm injectable monopolar needle electrode   left Number of units per muscle  Quadriceps 0 units Gastroc/soleus 300 units Hamstrings 0 units Tibialis Posterior 100 units Tibialis Anterior 0 units EHL 0 units All injections were done after obtaining appropriate EMG activity and after negative drawback for blood. The patient tolerated the procedure well. Post procedure instructions were given. Return in about 3 months (around 04/23/2021).

## 2021-01-21 NOTE — Patient Instructions (Signed)
PLEASE FEEL FREE TO CALL OUR OFFICE WITH ANY PROBLEMS OR QUESTIONS ([phone])      

## 2021-01-22 ENCOUNTER — Other Ambulatory Visit: Payer: Self-pay

## 2021-01-22 ENCOUNTER — Other Ambulatory Visit: Payer: Self-pay | Admitting: Physical Medicine & Rehabilitation

## 2021-01-22 DIAGNOSIS — G890 Central pain syndrome: Secondary | ICD-10-CM

## 2021-01-22 DIAGNOSIS — F0630 Mood disorder due to known physiological condition, unspecified: Secondary | ICD-10-CM

## 2021-01-22 DIAGNOSIS — S069XAS Unspecified intracranial injury with loss of consciousness status unknown, sequela: Secondary | ICD-10-CM

## 2021-01-22 DIAGNOSIS — S069X9S Unspecified intracranial injury with loss of consciousness of unspecified duration, sequela: Secondary | ICD-10-CM

## 2021-01-23 MED ORDER — DIVALPROEX SODIUM 500 MG PO DR TAB: 500.0000 mg | DELAYED_RELEASE_TABLET | Freq: Two times a day (BID) | ORAL | 0 refills | Status: DC

## 2021-01-23 MED ORDER — QUETIAPINE FUMARATE 50 MG PO TABS: ORAL_TABLET | ORAL | 0 refills | Status: DC

## 2021-02-11 ENCOUNTER — Encounter (HOSPITAL_COMMUNITY): Payer: Self-pay | Admitting: Physical Medicine & Rehabilitation

## 2021-02-13 MED ORDER — AMITRIPTYLINE HCL 10 MG PO TABS: 10.0000 mg | ORAL_TABLET | Freq: Every day | ORAL | 2 refills | Status: DC

## 2021-02-19 ENCOUNTER — Other Ambulatory Visit: Payer: Self-pay

## 2021-02-19 DIAGNOSIS — F0630 Mood disorder due to known physiological condition, unspecified: Secondary | ICD-10-CM

## 2021-02-19 DIAGNOSIS — G8114 Spastic hemiplegia affecting left nondominant side: Secondary | ICD-10-CM

## 2021-02-19 DIAGNOSIS — G890 Central pain syndrome: Secondary | ICD-10-CM

## 2021-02-19 DIAGNOSIS — S069X9S Unspecified intracranial injury with loss of consciousness of unspecified duration, sequela: Secondary | ICD-10-CM

## 2021-02-19 DIAGNOSIS — S062X4S Diffuse traumatic brain injury with loss of consciousness of 6 hours to 24 hours, sequela: Secondary | ICD-10-CM

## 2021-02-20 MED ORDER — QUETIAPINE FUMARATE 50 MG PO TABS: ORAL_TABLET | ORAL | 0 refills | Status: DC

## 2021-02-20 MED ORDER — DIVALPROEX SODIUM 500 MG PO DR TAB: 500.0000 mg | DELAYED_RELEASE_TABLET | Freq: Two times a day (BID) | ORAL | 0 refills | Status: DC

## 2021-02-20 MED ORDER — HYDROCODONE-ACETAMINOPHEN 10-325 MG PO TABS: 1.0000 | ORAL_TABLET | Freq: Four times a day (QID) | ORAL | 0 refills | Status: DC | PRN

## 2021-04-01 ENCOUNTER — Other Ambulatory Visit: Payer: Self-pay

## 2021-04-01 DIAGNOSIS — S062X4S Diffuse traumatic brain injury with loss of consciousness of 6 hours to 24 hours, sequela: Secondary | ICD-10-CM

## 2021-04-01 DIAGNOSIS — G8114 Spastic hemiplegia affecting left nondominant side: Secondary | ICD-10-CM

## 2021-04-01 MED ORDER — HYDROCODONE-ACETAMINOPHEN 10-325 MG PO TABS: 1.0000 | ORAL_TABLET | Freq: Four times a day (QID) | ORAL | 0 refills | Status: DC | PRN

## 2021-04-01 NOTE — Telephone Encounter (Signed)
Pt is requesting hydrocodone last filled per pmp 02/20/21 . Was here on 01/21/21 next appt 04/22/21 .

## 2021-04-08 ENCOUNTER — Other Ambulatory Visit: Payer: Self-pay

## 2021-04-08 ENCOUNTER — Emergency Department
Admission: EM | Admit: 2021-04-08 | Discharge: 2021-04-08 | Disposition: A | Discharge status: Home / Self Care | Payer: BC Managed Care – PPO | Attending: Emergency Medicine | Admitting: Emergency Medicine

## 2021-04-08 ENCOUNTER — Encounter: Payer: Self-pay | Admitting: Emergency Medicine

## 2021-04-08 DIAGNOSIS — Z87891 Personal history of nicotine dependence: Secondary | ICD-10-CM | POA: Diagnosis not present

## 2021-04-08 DIAGNOSIS — R319 Hematuria, unspecified: Secondary | ICD-10-CM

## 2021-04-08 DIAGNOSIS — Z7901 Long term (current) use of anticoagulants: Secondary | ICD-10-CM | POA: Insufficient documentation

## 2021-04-08 DIAGNOSIS — Z79899 Other long term (current) drug therapy: Secondary | ICD-10-CM | POA: Diagnosis not present

## 2021-04-08 LAB — BASIC METABOLIC PANEL
Anion gap: 10 (ref 5–15)
BUN: 8 mg/dL (ref 6–20)
CO2: 27 mmol/L (ref 22–32)
Calcium: 9.7 mg/dL (ref 8.9–10.3)
Chloride: 100 mmol/L (ref 98–111)
Creatinine, Ser: 0.6 mg/dL — ABNORMAL LOW (ref 0.61–1.24)
GFR, Estimated: >60 mL/min (ref >60)
Glucose, Bld: 88 mg/dL (ref 70–99)
Potassium: 3.9 mmol/L (ref 3.5–5.1)
Sodium: 137 mmol/L (ref 135–145)

## 2021-04-08 LAB — CBC WITH DIFFERENTIAL/PLATELET
Abs Immature Granulocytes: 0.01 10*3/uL (ref 0.00–0.07)
Basophils Absolute: 0 10*3/uL (ref 0.0–0.1)
Basophils Relative: 0 %
Eosinophils Absolute: 0.1 10*3/uL (ref 0.0–0.5)
Eosinophils Relative: 1 %
HCT: 46.8 % (ref 39.0–52.0)
Hemoglobin: 16.7 g/dL (ref 13.0–17.0)
Immature Granulocytes: 0 %
Lymphocytes Relative: 39 %
Lymphs Abs: 2.3 10*3/uL (ref 0.7–4.0)
MCH: 33.9 pg (ref 26.0–34.0)
MCHC: 35.7 g/dL (ref 30.0–36.0)
MCV: 95.1 fL (ref 80.0–100.0)
Monocytes Absolute: 0.7 10*3/uL (ref 0.1–1.0)
Monocytes Relative: 11 %
Neutro Abs: 2.8 10*3/uL (ref 1.7–7.7)
Neutrophils Relative %: 49 %
Platelets: 331 10*3/uL (ref 150–400)
RBC: 4.92 MIL/uL (ref 4.22–5.81)
RDW: 12.9 % (ref 11.5–15.5)
WBC: 5.9 10*3/uL (ref 4.0–10.5)
nRBC: 0 % (ref 0.0–0.2)

## 2021-04-08 LAB — APTT: aPTT: 38 seconds — ABNORMAL HIGH (ref 24–36)

## 2021-04-08 LAB — PROTIME-INR
INR: 1.1 (ref 0.8–1.2)
Prothrombin Time: 14 seconds (ref 11.4–15.2)
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
## 2021-04-08 NOTE — ED Notes (Signed)
See triage note  Per caregiver he has been fatigued over the past couple of days  No fever   But she noticed blood in urine last pm  Then notice a large amt in depends PTA

## 2021-04-08 NOTE — ED Triage Notes (Signed)
Hasn't slept well in two day.  Yesterday patient was weak.  Yesterday evening hematuria noted.  Cough this morning, cough, decreased appetite.  Caregiver reports no void x 18 hours, changed depend at noon, blood clots seen and hematuria.

## 2021-04-16 ENCOUNTER — Other Ambulatory Visit: Payer: Self-pay

## 2021-04-16 DIAGNOSIS — S069X9S Unspecified intracranial injury with loss of consciousness of unspecified duration, sequela: Secondary | ICD-10-CM

## 2021-04-16 DIAGNOSIS — G890 Central pain syndrome: Secondary | ICD-10-CM

## 2021-04-16 DIAGNOSIS — F0630 Mood disorder due to known physiological condition, unspecified: Secondary | ICD-10-CM

## 2021-04-16 MED ORDER — DIVALPROEX SODIUM 500 MG PO DR TAB: 500.0000 mg | DELAYED_RELEASE_TABLET | Freq: Two times a day (BID) | ORAL | 0 refills | Status: DC

## 2021-04-16 MED ORDER — QUETIAPINE FUMARATE 50 MG PO TABS: ORAL_TABLET | ORAL | 0 refills | Status: DC

## 2021-04-22 ENCOUNTER — Encounter: Payer: BC Managed Care – PPO | Admitting: Physical Medicine & Rehabilitation

## 2021-04-23 ENCOUNTER — Other Ambulatory Visit: Payer: Self-pay

## 2021-04-23 ENCOUNTER — Ambulatory Visit (INDEPENDENT_AMBULATORY_CARE_PROVIDER_SITE_OTHER): Payer: BC Managed Care – PPO | Admitting: Urology

## 2021-04-23 ENCOUNTER — Encounter: Payer: Self-pay | Admitting: Urology

## 2021-04-23 VITALS — BP 124/79 | HR 101 | Ht 71.0 in | Wt 156.0 lb

## 2021-04-23 DIAGNOSIS — R310 Gross hematuria: Secondary | ICD-10-CM | POA: Diagnosis not present

## 2021-04-23 DIAGNOSIS — N319 Neuromuscular dysfunction of bladder, unspecified: Secondary | ICD-10-CM | POA: Diagnosis not present

## 2021-04-23 MED ORDER — SULFAMETHOXAZOLE-TRIMETHOPRIM 800-160 MG PO TABS: 1.0000 | ORAL_TABLET | Freq: Two times a day (BID) | ORAL | 0 refills | Status: AC

## 2021-04-23 NOTE — Patient Instructions (Signed)
Neurogenic Bladder  Neurogenic bladder is a bladder control disorder. It is usually caused by problems with the nerves that control the bladder. The brain sends signals through the spinal cord to the muscles in the bladder that start and stop urine flow. With neurogenic bladder, the nerves and muscles do not work together theway they should. This condition may make the bladder overactive, meaning you have trouble holding urine. In other cases, it may make the bladder underactive. This meansthat you have trouble passing urine. What are the causes? This condition may be caused by nerve damage or a condition that disrupts the signals from your brain to your bladder. Many things can cause these nerve problems, including: A disease that affects the nervous system, such as: Alzheimer's disease. Cerebral palsy. Multiple sclerosis. Diabetes. Parkinson's disease. Damage to your brain or spinal cord. This can come from: Trauma. Tumors. Infection. Surgery. Alcohol abuse. Stroke. A congenital disability that affects the spinal cord. What increases the risk? You are more likely to develop this condition if you have nerve damage or anerve disorder. What are the signs or symptoms? Signs and symptoms of this condition include: Leaking or gushing urine (incontinence). A sudden, strong urge to pass urine (urgency). Frequent urination during the day and night. Being unable to empty your bladder completely (urinary retention). Frequent urinary tract infections. How is this diagnosed? This condition may be diagnosed based on: Your symptoms and medical history. A physical exam. Records from a bladder diary. You may be asked to keep a record or log of your bladder symptoms and the times that you urinate. You may also have tests, such as: A urine test to check for infection. A bladder scan after you urinate to see how much urine is left in your bladder. Tests to measure your urine flow and see how well  the flow is controlled (urodynamic tests). A procedure that uses a small device with a camera to look through your urethra into your bladder (cystoscopy). A health care provider who specializes in the urinary tract (urologist) may do this test. Imaging tests of your brain or spine, such as MRI or CT scan. How is this treated? Treatment for this condition depends on the cause and the symptoms that you have. Work closely with your health care provider to find the treatments that will improve your quality of life. Treatment options include: Learning ways to control when you urinate, such as: Urinating at scheduled times. Training yourself to delay urination. Exercises to strengthen the muscles that control urine flow (Kegel exercises). Avoiding foods or drinks that make your symptoms worse. Taking medicines to: Stimulate an underactive bladder. Relax an overactive bladder. Treat a urinary tract infection. Learning how to use a thin tube (catheter) to empty your bladder. A catheter is a hollow tube that you pass through your urethra. Procedures to stimulate the nerves that control your bladder. Surgery, if other treatments do not help. Follow these instructions at home: Lifestyle  Keep a bladder diary to find out which foods, liquids, or activities make your symptoms worse. Use your bladder diary to schedule bathroom trips. If you are away from home, plan to be near a bathroom when your schedule says you will need one. Limit beverages that stimulate urination. These include soda, coffee, and tea. After urinating, wait a few minutes and try again. Make sure you urinate just before you leave the house and just before you go to bed.  Kegel exercises Do Kegel exercises to strengthen the muscles that control   the passing of urine. These muscles are the ones you use to try to hold urine when you need to urinate. To do Kegel exercises: Squeeze your pelvic floor muscles tight, as if you are trying to  stop the flow of urine. You should feel a tight lift in your rectal area. If you are male, you should also feel a tightness in your vaginal area. Keep your stomach, buttocks, and legs relaxed. Hold the muscles tight for 5-10 seconds. Relax your muscles for the same amount of time. Repeat 10 times. Repeat this exercise 3 times a day or as many times as told by your health careprovider. General instructions Take over-the-counter and prescription medicines only as told by your health care provider. Keep all follow-up visits. This is important. Contact a health care provider if: You are having a hard time controlling your symptoms. Your symptoms are getting worse. You have signs of a urinary tract infection. These may include: A burning feeling when you urinate. Fever or chills. Cloudy or bloody urine. Get help right away if: You cannot pass urine. Summary Neurogenic bladder is a bladder control disorder caused by problems with the nerves that control the bladder. This condition may make the bladder overactive or underactive. This condition may be caused by nerve damage or a condition that disrupts the signals from your brain to your bladder. Treatment depends on the cause of your neurogenic bladder and the symptoms that you have. Work closely with your health care provider to find the treatments that will improve your quality of life. This information is not intended to replace advice given to you by your health care provider. Make sure you discuss any questions you have with your healthcare provider. Document Revised: 05/22/2020 Document Reviewed: 05/22/2020 Elsevier Patient Education  2022 [employer].
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
## 2021-05-13 ENCOUNTER — Other Ambulatory Visit: Payer: Self-pay

## 2021-05-13 DIAGNOSIS — S062X4S Diffuse traumatic brain injury with loss of consciousness of 6 hours to 24 hours, sequela: Secondary | ICD-10-CM

## 2021-05-13 DIAGNOSIS — G8114 Spastic hemiplegia affecting left nondominant side: Secondary | ICD-10-CM

## 2021-05-13 MED ORDER — HYDROCODONE-ACETAMINOPHEN 10-325 MG PO TABS: 1.0000 | ORAL_TABLET | Freq: Four times a day (QID) | ORAL | 0 refills | Status: DC | PRN

## 2021-12-02 ENCOUNTER — Other Ambulatory Visit: Payer: Self-pay

## 2021-12-02 ENCOUNTER — Ambulatory Visit (HOSPITAL_COMMUNITY): Payer: BC Managed Care – PPO | Attending: Physical Medicine and Rehabilitation

## 2021-12-02 DIAGNOSIS — G8114 Spastic hemiplegia affecting left nondominant side: Secondary | ICD-10-CM | POA: Insufficient documentation
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
## 2021-12-02 NOTE — Patient Instructions (Signed)
Repeat all exercises 10-15 times, 1-2 times per day. ? ?1) Shoulder Protraction ? ? ? ?Begin with elbows by your side, slowly "punch" straight out in front of you.   ? ? ? ?2) Shoulder Flexion ? ? ?      ? ?Begin with arms at your side with thumbs pointed up, slowly raise both arms up and forward towards overhead.  ? ? ? ?3) Horizontal abduction/adduction ? ? ? ?       ? ? ?Begin with arms straight out in front of you, bring out to the side in at "T" shape. Keep arms straight entire time.  ? ? ? ? ?4) Internal & External Rotation ? ?   ? ?Stand with elbows at the side and elbows bent 90 degrees. Move your forearms away from your body, then bring back inward toward the body.   ? ? ?5) Shoulder Abduction ? ? ? ? ?  ? ?Begin with your arms flat next to your side. Slowly move your arms out to the side so that they go overhead, in a jumping jack or snow angel movement.  ? ? ? ?  ?

## 2021-12-21 ENCOUNTER — Ambulatory Visit (HOSPITAL_COMMUNITY): Payer: BC Managed Care – PPO | Attending: Physical Medicine and Rehabilitation | Admitting: Physical Therapy

## 2021-12-21 DIAGNOSIS — G8114 Spastic hemiplegia affecting left nondominant side: Secondary | ICD-10-CM | POA: Insufficient documentation

## 2021-12-21 DIAGNOSIS — R2689 Other abnormalities of gait and mobility: Secondary | ICD-10-CM | POA: Insufficient documentation

## 2021-12-21 DIAGNOSIS — R252 Cramp and spasm: Secondary | ICD-10-CM | POA: Insufficient documentation

## 2021-12-21 DIAGNOSIS — R293 Abnormal posture: Secondary | ICD-10-CM | POA: Insufficient documentation

## 2021-12-21 DIAGNOSIS — R2681 Unsteadiness on feet: Secondary | ICD-10-CM | POA: Insufficient documentation

## 2021-12-21 NOTE — Therapy (Signed)
Patient Name: Charles Arroyo ?MRN: [id number] ?DOB:2001-04-24, 21 y.o., male ?Today's Date: 12/21/2021 ? ?Patient was seen as a screen today rather than an evaluation, but as OT services have also said we will wait for after the patient's follow up with the physician on Wednesday 12/23/21 as the patient's mother indicates that there may be an update/change in the patient's LUE status. He has been scheduled for both PT and OT services following that appointment. I did give the patient and his mother two exercises to work on in the mean time to combat the spasticity in the LLE.  ? ? ?3:38 PM,12/21/21 ?Creig Hines, DPT ?[facility]  ? ?

## 2021-12-31 ENCOUNTER — Encounter (HOSPITAL_COMMUNITY): Payer: Self-pay | Admitting: Physical Therapy

## 2021-12-31 ENCOUNTER — Ambulatory Visit (HOSPITAL_COMMUNITY): Payer: BC Managed Care – PPO | Admitting: Physical Therapy

## 2021-12-31 DIAGNOSIS — R2681 Unsteadiness on feet: Secondary | ICD-10-CM | POA: Diagnosis present

## 2021-12-31 DIAGNOSIS — R252 Cramp and spasm: Secondary | ICD-10-CM | POA: Diagnosis present

## 2021-12-31 DIAGNOSIS — G8114 Spastic hemiplegia affecting left nondominant side: Secondary | ICD-10-CM

## 2021-12-31 DIAGNOSIS — R2689 Other abnormalities of gait and mobility: Secondary | ICD-10-CM

## 2021-12-31 DIAGNOSIS — R293 Abnormal posture: Secondary | ICD-10-CM | POA: Diagnosis present
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
## 2022-01-05 ENCOUNTER — Ambulatory Visit (HOSPITAL_COMMUNITY): Payer: BC Managed Care – PPO | Admitting: Physical Therapy

## 2022-01-05 ENCOUNTER — Encounter (HOSPITAL_COMMUNITY): Payer: Self-pay | Admitting: Physical Therapy

## 2022-01-05 DIAGNOSIS — G8114 Spastic hemiplegia affecting left nondominant side: Secondary | ICD-10-CM

## 2022-01-05 DIAGNOSIS — R2689 Other abnormalities of gait and mobility: Secondary | ICD-10-CM
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
## 2022-01-08 ENCOUNTER — Ambulatory Visit (HOSPITAL_COMMUNITY): Payer: BC Managed Care – PPO

## 2022-01-12 ENCOUNTER — Ambulatory Visit (HOSPITAL_COMMUNITY): Payer: BC Managed Care – PPO | Admitting: Physical Therapy

## 2022-01-12 DIAGNOSIS — R252 Cramp and spasm: Secondary | ICD-10-CM

## 2022-01-12 DIAGNOSIS — G8114 Spastic hemiplegia affecting left nondominant side: Secondary | ICD-10-CM | POA: Diagnosis not present

## 2022-01-12 DIAGNOSIS — R2681 Unsteadiness on feet: Secondary | ICD-10-CM

## 2022-01-12 DIAGNOSIS — R2689 Other abnormalities of gait and mobility: Secondary | ICD-10-CM

## 2022-01-12 DIAGNOSIS — R293 Abnormal posture: Secondary | ICD-10-CM
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
## 2022-01-21 ENCOUNTER — Ambulatory Visit (HOSPITAL_COMMUNITY): Payer: BC Managed Care – PPO

## 2022-01-21 ENCOUNTER — Telehealth (HOSPITAL_COMMUNITY): Payer: Self-pay

## 2022-01-21 NOTE — Telephone Encounter (Signed)
Left message concerning insurance approval dates not covered.  Reminded next apt schedule and will contact if not approved that date. ? ?Ihor Austin, LPTA/CLT; CBIS ?[phone] ? ?

## 2022-01-26 ENCOUNTER — Ambulatory Visit (HOSPITAL_COMMUNITY): Payer: BC Managed Care – PPO

## 2022-01-28 ENCOUNTER — Telehealth (HOSPITAL_COMMUNITY): Payer: Self-pay

## 2022-01-28 ENCOUNTER — Ambulatory Visit (HOSPITAL_COMMUNITY): Payer: BC Managed Care – PPO | Attending: Physical Medicine and Rehabilitation

## 2022-01-28 DIAGNOSIS — G8114 Spastic hemiplegia affecting left nondominant side: Secondary | ICD-10-CM | POA: Diagnosis present

## 2022-01-28 DIAGNOSIS — R2681 Unsteadiness on feet: Secondary | ICD-10-CM | POA: Insufficient documentation

## 2022-01-28 DIAGNOSIS — R2689 Other abnormalities of gait and mobility: Secondary | ICD-10-CM | POA: Diagnosis present

## 2022-01-28 DIAGNOSIS — R29818 Other symptoms and signs involving the nervous system: Secondary | ICD-10-CM | POA: Insufficient documentation

## 2022-01-28 DIAGNOSIS — R252 Cramp and spasm: Secondary | ICD-10-CM | POA: Insufficient documentation

## 2022-01-28 NOTE — Telephone Encounter (Signed)
Called and left message for Mother regarding scheduled OT evaluation today for LUE. Inquired if we had to "ok" from surgeon to start OT on the left arm or if this evaluation appointment needs to be rescheduled.  ?Asked to call back clinic and let us know.  ? ?Limmie Patricia, OTR/L,CBIS  ?[phone] ? ?

## 2022-01-29 ENCOUNTER — Ambulatory Visit (HOSPITAL_COMMUNITY): Payer: BC Managed Care – PPO

## 2022-01-29 ENCOUNTER — Encounter (HOSPITAL_COMMUNITY): Payer: Self-pay

## 2022-01-29 DIAGNOSIS — G8114 Spastic hemiplegia affecting left nondominant side: Secondary | ICD-10-CM

## 2022-01-29 DIAGNOSIS — R29818 Other symptoms and signs involving the nervous system: Secondary | ICD-10-CM

## 2022-01-29 DIAGNOSIS — R2689 Other abnormalities of gait and mobility: Secondary | ICD-10-CM
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
## 2022-02-02 ENCOUNTER — Ambulatory Visit (HOSPITAL_COMMUNITY): Payer: BC Managed Care – PPO

## 2022-02-02 ENCOUNTER — Telehealth (HOSPITAL_COMMUNITY): Payer: Self-pay

## 2022-02-02 ENCOUNTER — Encounter (HOSPITAL_COMMUNITY): Payer: Self-pay

## 2022-02-02 DIAGNOSIS — R29818 Other symptoms and signs involving the nervous system: Secondary | ICD-10-CM

## 2022-02-02 DIAGNOSIS — R2681 Unsteadiness on feet: Secondary | ICD-10-CM

## 2022-02-02 DIAGNOSIS — R252 Cramp and spasm: Secondary | ICD-10-CM

## 2022-02-02 DIAGNOSIS — R2689 Other abnormalities of gait and mobility: Secondary | ICD-10-CM

## 2022-02-02 DIAGNOSIS — G8114 Spastic hemiplegia affecting left nondominant side: Secondary | ICD-10-CM | POA: Diagnosis not present
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
## 2022-02-02 NOTE — Telephone Encounter (Signed)
Per Lelon Mast from [facility] stated it was okay to use serial casting. Can call her back at number listed above if anything else is needed.  ?

## 2022-02-03 ENCOUNTER — Telehealth (HOSPITAL_COMMUNITY): Payer: Self-pay

## 2022-02-03 NOTE — Telephone Encounter (Signed)
Spoke to Quincy Carnes MD regarding recommended bracing as noted in assessment of note on 02/02/22 ?

## 2022-02-04 ENCOUNTER — Ambulatory Visit (HOSPITAL_COMMUNITY): Payer: BC Managed Care – PPO

## 2022-02-04 ENCOUNTER — Encounter (HOSPITAL_COMMUNITY): Payer: Self-pay

## 2022-02-04 DIAGNOSIS — G8114 Spastic hemiplegia affecting left nondominant side: Secondary | ICD-10-CM | POA: Diagnosis not present

## 2022-02-04 DIAGNOSIS — R252 Cramp and spasm: Secondary | ICD-10-CM

## 2022-02-04 DIAGNOSIS — R2689 Other abnormalities of gait and mobility: Secondary | ICD-10-CM

## 2022-02-04 DIAGNOSIS — R29818 Other symptoms and signs involving the nervous system: Secondary | ICD-10-CM

## 2022-02-04 DIAGNOSIS — R2681 Unsteadiness on feet: Secondary | ICD-10-CM
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
## 2022-02-10 ENCOUNTER — Encounter (HOSPITAL_COMMUNITY): Payer: Self-pay

## 2022-02-10 ENCOUNTER — Ambulatory Visit (HOSPITAL_COMMUNITY): Payer: BC Managed Care – PPO

## 2022-02-10 DIAGNOSIS — R2681 Unsteadiness on feet: Secondary | ICD-10-CM

## 2022-02-10 DIAGNOSIS — G8114 Spastic hemiplegia affecting left nondominant side: Secondary | ICD-10-CM | POA: Diagnosis not present

## 2022-02-10 DIAGNOSIS — R29818 Other symptoms and signs involving the nervous system: Secondary | ICD-10-CM

## 2022-02-10 DIAGNOSIS — R2689 Other abnormalities of gait and mobility: Secondary | ICD-10-CM
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
## 2022-02-12 ENCOUNTER — Ambulatory Visit (HOSPITAL_COMMUNITY): Payer: BC Managed Care – PPO

## 2022-02-12 ENCOUNTER — Telehealth (HOSPITAL_COMMUNITY): Payer: Self-pay

## 2022-02-12 NOTE — Telephone Encounter (Signed)
Mom called back to apologize she did not write down the time change on this date and she hated that she missed this appointment. She also wanted you to know that they have a brace appointment on 02/23/2022. Do you want to reduce to once a week, please let her know when they return on 02/19/2022.

## 2022-02-12 NOTE — Telephone Encounter (Signed)
Patient No show session on 02/12/22. left VM letting them know of NO show policy and to call to cancel if unable to make a session.

## 2022-02-15 ENCOUNTER — Ambulatory Visit (HOSPITAL_COMMUNITY): Payer: BC Managed Care – PPO | Admitting: Physical Therapy

## 2022-02-17 ENCOUNTER — Ambulatory Visit (HOSPITAL_COMMUNITY): Payer: BC Managed Care – PPO | Attending: Physical Medicine and Rehabilitation

## 2022-02-17 ENCOUNTER — Encounter (HOSPITAL_COMMUNITY): Payer: Self-pay

## 2022-02-17 DIAGNOSIS — R29818 Other symptoms and signs involving the nervous system: Secondary | ICD-10-CM

## 2022-02-17 DIAGNOSIS — G8114 Spastic hemiplegia affecting left nondominant side: Secondary | ICD-10-CM | POA: Diagnosis present

## 2022-02-17 DIAGNOSIS — R2681 Unsteadiness on feet: Secondary | ICD-10-CM

## 2022-02-17 DIAGNOSIS — R2689 Other abnormalities of gait and mobility: Secondary | ICD-10-CM | POA: Insufficient documentation
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
## 2022-02-19 ENCOUNTER — Ambulatory Visit (HOSPITAL_COMMUNITY): Payer: BC Managed Care – PPO

## 2022-02-22 ENCOUNTER — Ambulatory Visit (HOSPITAL_COMMUNITY): Payer: BC Managed Care – PPO

## 2022-02-24 ENCOUNTER — Ambulatory Visit (HOSPITAL_COMMUNITY): Payer: BC Managed Care – PPO | Attending: Physical Medicine and Rehabilitation

## 2022-02-24 DIAGNOSIS — R2689 Other abnormalities of gait and mobility: Secondary | ICD-10-CM | POA: Diagnosis present

## 2022-02-24 DIAGNOSIS — G8114 Spastic hemiplegia affecting left nondominant side: Secondary | ICD-10-CM | POA: Insufficient documentation

## 2022-02-24 DIAGNOSIS — R29818 Other symptoms and signs involving the nervous system: Secondary | ICD-10-CM | POA: Diagnosis present

## 2022-02-24 DIAGNOSIS — R2681 Unsteadiness on feet: Secondary | ICD-10-CM | POA: Insufficient documentation

## 2022-02-24 DIAGNOSIS — G8250 Quadriplegia, unspecified: Secondary | ICD-10-CM | POA: Diagnosis present

## 2022-02-24 DIAGNOSIS — R293 Abnormal posture: Secondary | ICD-10-CM | POA: Insufficient documentation

## 2022-02-24 DIAGNOSIS — R252 Cramp and spasm: Secondary | ICD-10-CM | POA: Diagnosis present
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
## 2022-03-02 ENCOUNTER — Ambulatory Visit (HOSPITAL_COMMUNITY): Payer: BC Managed Care – PPO | Attending: Physical Medicine and Rehabilitation

## 2022-03-02 ENCOUNTER — Encounter (HOSPITAL_COMMUNITY): Payer: Self-pay

## 2022-03-02 DIAGNOSIS — R2689 Other abnormalities of gait and mobility: Secondary | ICD-10-CM | POA: Diagnosis present

## 2022-03-02 DIAGNOSIS — G8114 Spastic hemiplegia affecting left nondominant side: Secondary | ICD-10-CM | POA: Diagnosis present

## 2022-03-02 DIAGNOSIS — R29818 Other symptoms and signs involving the nervous system: Secondary | ICD-10-CM | POA: Diagnosis present
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
## 2022-03-25 ENCOUNTER — Ambulatory Visit (HOSPITAL_COMMUNITY): Payer: BC Managed Care – PPO | Admitting: Physical Therapy

## 2022-03-30 ENCOUNTER — Ambulatory Visit (HOSPITAL_COMMUNITY): Payer: BC Managed Care – PPO

## 2022-04-01 ENCOUNTER — Ambulatory Visit (HOSPITAL_COMMUNITY): Payer: BC Managed Care – PPO | Attending: Physical Medicine and Rehabilitation

## 2022-04-01 ENCOUNTER — Encounter (HOSPITAL_COMMUNITY): Payer: Self-pay

## 2022-04-01 DIAGNOSIS — R29818 Other symptoms and signs involving the nervous system: Secondary | ICD-10-CM

## 2022-04-01 DIAGNOSIS — R2681 Unsteadiness on feet: Secondary | ICD-10-CM | POA: Diagnosis present

## 2022-04-01 DIAGNOSIS — R293 Abnormal posture: Secondary | ICD-10-CM | POA: Diagnosis present

## 2022-04-01 DIAGNOSIS — R2689 Other abnormalities of gait and mobility: Secondary | ICD-10-CM | POA: Diagnosis present

## 2022-04-01 DIAGNOSIS — R252 Cramp and spasm: Secondary | ICD-10-CM | POA: Diagnosis present

## 2022-04-01 DIAGNOSIS — G8114 Spastic hemiplegia affecting left nondominant side: Secondary | ICD-10-CM | POA: Diagnosis present
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
## 2022-04-01 NOTE — Addendum Note (Signed)
Addended by: Aleatha Borer on: 04/01/2022 12:13 PM   Modules accepted: Orders

## 2022-04-05 ENCOUNTER — Encounter (HOSPITAL_COMMUNITY): Payer: Self-pay

## 2022-04-05 ENCOUNTER — Encounter (HOSPITAL_COMMUNITY): Payer: BC Managed Care – PPO

## 2022-04-05 ENCOUNTER — Ambulatory Visit (HOSPITAL_COMMUNITY): Payer: BC Managed Care – PPO

## 2022-04-05 DIAGNOSIS — R2689 Other abnormalities of gait and mobility: Secondary | ICD-10-CM

## 2022-04-05 DIAGNOSIS — R29818 Other symptoms and signs involving the nervous system: Secondary | ICD-10-CM

## 2022-04-05 DIAGNOSIS — G8114 Spastic hemiplegia affecting left nondominant side: Secondary | ICD-10-CM | POA: Diagnosis not present

## 2022-04-05 DIAGNOSIS — R2681 Unsteadiness on feet: Secondary | ICD-10-CM

## 2022-04-05 DIAGNOSIS — R293 Abnormal posture: Secondary | ICD-10-CM
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
## 2022-04-07 ENCOUNTER — Encounter (HOSPITAL_COMMUNITY): Payer: Self-pay

## 2022-04-07 ENCOUNTER — Ambulatory Visit (HOSPITAL_COMMUNITY): Payer: BC Managed Care – PPO

## 2022-04-07 DIAGNOSIS — R2689 Other abnormalities of gait and mobility: Secondary | ICD-10-CM

## 2022-04-07 DIAGNOSIS — G8114 Spastic hemiplegia affecting left nondominant side: Secondary | ICD-10-CM | POA: Diagnosis not present

## 2022-04-07 DIAGNOSIS — R29818 Other symptoms and signs involving the nervous system: Secondary | ICD-10-CM

## 2022-04-07 DIAGNOSIS — R2681 Unsteadiness on feet: Secondary | ICD-10-CM

## 2022-04-07 DIAGNOSIS — R293 Abnormal posture: Secondary | ICD-10-CM
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
## 2022-04-12 ENCOUNTER — Ambulatory Visit (HOSPITAL_COMMUNITY): Payer: BC Managed Care – PPO

## 2022-04-12 ENCOUNTER — Ambulatory Visit (HOSPITAL_COMMUNITY): Payer: BC Managed Care – PPO | Admitting: Physical Therapy

## 2022-04-14 ENCOUNTER — Ambulatory Visit (HOSPITAL_COMMUNITY): Payer: BC Managed Care – PPO | Admitting: Physical Therapy

## 2023-03-29 ENCOUNTER — Emergency Department (HOSPITAL_COMMUNITY): Payer: Medicaid Other

## 2023-03-29 ENCOUNTER — Emergency Department (HOSPITAL_COMMUNITY)
Admission: EM | Admit: 2023-03-29 | Discharge: 2023-03-29 | Disposition: A | Discharge status: Home / Self Care | Payer: Medicaid Other | Attending: Emergency Medicine | Admitting: Emergency Medicine

## 2023-03-29 ENCOUNTER — Other Ambulatory Visit: Payer: Self-pay

## 2023-03-29 ENCOUNTER — Encounter (HOSPITAL_COMMUNITY): Payer: Self-pay | Admitting: Emergency Medicine

## 2023-03-29 DIAGNOSIS — Z7901 Long term (current) use of anticoagulants: Secondary | ICD-10-CM | POA: Insufficient documentation

## 2023-03-29 DIAGNOSIS — R531 Weakness: Secondary | ICD-10-CM | POA: Insufficient documentation

## 2023-03-29 DIAGNOSIS — N12 Tubulo-interstitial nephritis, not specified as acute or chronic: Secondary | ICD-10-CM | POA: Insufficient documentation

## 2023-03-29 DIAGNOSIS — R5383 Other fatigue: Secondary | ICD-10-CM | POA: Diagnosis not present

## 2023-03-29 DIAGNOSIS — R519 Headache, unspecified: Secondary | ICD-10-CM | POA: Insufficient documentation

## 2023-03-29 DIAGNOSIS — R509 Fever, unspecified: Secondary | ICD-10-CM | POA: Diagnosis present

## 2023-03-29 DIAGNOSIS — Z20822 Contact with and (suspected) exposure to covid-19: Secondary | ICD-10-CM | POA: Insufficient documentation

## 2023-03-29 DIAGNOSIS — R000 Tachycardia, unspecified: Secondary | ICD-10-CM | POA: Insufficient documentation

## 2023-03-29 DIAGNOSIS — D72829 Elevated white blood cell count, unspecified: Secondary | ICD-10-CM | POA: Diagnosis not present

## 2023-03-29 LAB — URINALYSIS, ROUTINE W REFLEX MICROSCOPIC
Bilirubin Urine: NEGATIVE
Glucose, UA: NEGATIVE mg/dL
Ketones, ur: NEGATIVE mg/dL
Nitrite: POSITIVE — AB
Protein, ur: 30 mg/dL — AB
Specific Gravity, Urine: <1.005 — ABNORMAL LOW (ref 1.005–1.030)
pH: 6 (ref 5.0–8.0)

## 2023-03-29 LAB — BASIC METABOLIC PANEL
Anion gap: 9 (ref 5–15)
BUN: 11 mg/dL (ref 6–20)
CO2: 25 mmol/L (ref 22–32)
Calcium: 8.8 mg/dL — ABNORMAL LOW (ref 8.9–10.3)
Chloride: 96 mmol/L — ABNORMAL LOW (ref 98–111)
Creatinine, Ser: 0.97 mg/dL (ref 0.61–1.24)
GFR, Estimated: >60 mL/min (ref >60)
Glucose, Bld: 139 mg/dL — ABNORMAL HIGH (ref 70–99)
Potassium: 3.8 mmol/L (ref 3.5–5.1)
Sodium: 130 mmol/L — ABNORMAL LOW (ref 135–145)

## 2023-03-29 LAB — CBC
HCT: 40.4 % (ref 39.0–52.0)
Hemoglobin: 14.1 g/dL (ref 13.0–17.0)
MCH: 32.6 pg (ref 26.0–34.0)
MCHC: 34.9 g/dL (ref 30.0–36.0)
MCV: 93.5 fL (ref 80.0–100.0)
Platelets: 202 10*3/uL (ref 150–400)
RBC: 4.32 MIL/uL (ref 4.22–5.81)
RDW: 13.2 % (ref 11.5–15.5)
WBC: 19.1 10*3/uL — ABNORMAL HIGH (ref 4.0–10.5)
nRBC: 0 % (ref 0.0–0.2)

## 2023-03-29 LAB — HEPATIC FUNCTION PANEL
ALT: 29 U/L (ref 0–44)
AST: 16 U/L (ref 15–41)
Albumin: 3.2 g/dL — ABNORMAL LOW (ref 3.5–5.0)
Alkaline Phosphatase: 64 U/L (ref 38–126)
Bilirubin, Direct: 0.2 mg/dL (ref 0.0–0.2)
Indirect Bilirubin: 0.5 mg/dL (ref 0.3–0.9)
Total Bilirubin: 0.7 mg/dL (ref 0.3–1.2)
Total Protein: 7 g/dL (ref 6.5–8.1)

## 2023-03-29 LAB — LIPASE, BLOOD: Lipase: 21 U/L (ref 11–51)

## 2023-03-29 LAB — SARS CORONAVIRUS 2 BY RT PCR: SARS Coronavirus 2 by RT PCR: NEGATIVE

## 2023-03-29 LAB — GROUP A STREP BY PCR: Group A Strep by PCR: NOT DETECTED

## 2023-03-29 LAB — LACTIC ACID, PLASMA: Lactic Acid, Venous: 1.7 mmol/L (ref 0.5–1.9)

## 2023-03-29 MED ORDER — ACETAMINOPHEN 325 MG PO TABS
650.0000 mg | ORAL_TABLET | Freq: Once | ORAL | Status: AC
  Administered 2023-03-29: 650 mg via ORAL
  Filled 2023-03-29: qty 2

## 2023-03-29 MED ORDER — IOHEXOL 300 MG/ML  SOLN
100.0000 mL | Freq: Once | INTRAMUSCULAR | Status: AC | PRN
  Administered 2023-03-29: 100 mL via INTRAVENOUS

## 2023-03-29 MED ORDER — CEPHALEXIN 500 MG PO CAPS: 500.0000 mg | ORAL_CAPSULE | Freq: Three times a day (TID) | ORAL | 0 refills | Status: AC

## 2023-03-29 MED ORDER — SODIUM CHLORIDE 0.9 % IV SOLN
1.0000 g | Freq: Once | INTRAVENOUS | Status: AC
  Administered 2023-03-29: 1 g via INTRAVENOUS
  Filled 2023-03-29: qty 10

## 2023-03-29 MED ORDER — DANTROLENE SODIUM 25 MG PO CAPS
50.0000 mg | ORAL_CAPSULE | Freq: Three times a day (TID) | ORAL | Status: DC
  Filled 2023-03-29 (×2): qty 2

## 2023-03-29 NOTE — ED Triage Notes (Signed)
Pt via POV c/o anterior and posterior headache since earlier today. Pt denies other symptoms or concerns. Temp 100.4 in triage and pt's visitor states he has been flushed and sweaty today. Pain rated 8/10.

## 2023-03-29 NOTE — ED Provider Notes (Signed)
EMERGENCY DEPARTMENT AT [facility] Provider Note   CSN: [id number] Arrival date & time: 03/29/23  1347     History  Chief Complaint  Patient presents with   Headache    Charles Arroyo is a 22 y.o. male.   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    Home Medications Prior to Admission medications   Medication Sig Start Date End Date Taking? Authorizing Provider  cephALEXin (KEFLEX) 500 MG capsule Take 1 capsule (500 mg total) by mouth 3 (three) times daily for 7 days. 03/29/23 04/05/23 Yes Eber Hong, MD  acetaminophen (TYLENOL) 325 MG tablet Take 650 mg by mouth every 6 (six) hours as needed for mild pain or moderate pain (*Must be crushed).    [provider]  amitriptyline (ELAVIL) 10 MG tablet Take 1 tablet (10 mg total) by mouth at bedtime. 02/13/21   Ranelle Oyster, MD  Bisacodyl (ALOPHEN PO) 1 tablet AS NEEDED (route: oral) 08/23/19   [provider]  dantrolene (DANTRIUM) 50 MG capsule TAKE 1 CAPSULE BY MOUTH FOUR TIMES A DAY. 11/18/20   Ranelle Oyster, MD  divalproex (DEPAKOTE) 250 MG DR tablet Take 250 mg by mouth every morning.    [provider]  divalproex (DEPAKOTE) 500 MG DR tablet Take 1 tablet (500 mg total) by mouth 2 (two) times daily. 04/16/21   Ranelle Oyster, MD  HYDROcodone-acetaminophen [facility]) 10-325 MG tablet Take 1 tablet by mouth every 6 (six) hours as needed. 05/13/21   Ranelle Oyster, MD  Magnesium 100 MG CAPS Take by mouth at bedtime.    [provider]  polyethylene glycol (MIRALAX / GLYCOLAX) 17 g packet Take 17 g by mouth daily.    [provider]  QUEtiapine (SEROQUEL) 50 MG tablet Take 150 mg by mouth at bedtime.    [provider]  XARELTO 10 MG TABS tablet Take 1 tablet (10 mg total) by mouth daily. 01/21/21   Ranelle Oyster, MD      Allergies    Patient has no known allergies.    Review of Systems   Review of Systems  Neurological:  Positive for headaches.  All other systems reviewed and are negative.   Physical Exam Updated Vital Signs BP 113/70   Pulse 89   Temp 98.2 F (36.8 C) (Axillary)   Resp (!) 23   Ht 1.803 m (5\' 11" )   Wt 86.2 kg   SpO2 99%   BMI 26.50 kg/m  Physical Exam Vitals and nursing note reviewed.  Constitutional:      General: He is not in acute distress.    Appearance: He is well-developed.  HENT:     Head: Normocephalic and atraumatic.     Mouth/Throat:     Pharynx: No oropharyngeal exudate.  Eyes:     General: No scleral icterus.       Right eye: No discharge.        Left eye: No discharge.     Conjunctiva/sclera: Conjunctivae normal.     Pupils: Pupils are equal, round, and reactive to light.  Neck:     Thyroid:  No thyromegaly.     Vascular: No JVD.  Cardiovascular:     Rate and Rhythm: Regular rhythm. Tachycardia present.     Heart sounds: Normal heart sounds. No murmur heard.    No friction rub. No gallop.  Pulmonary:     Effort: Pulmonary effort is normal. No respiratory distress.     Breath sounds: Normal breath sounds. No wheezing or rales.  Abdominal:     General: Bowel sounds are normal. There is no distension.     Palpations: Abdomen is soft. There is no mass.     Tenderness: There is abdominal tenderness.     Comments: Minimal abdominal tenderness, no guarding no peritoneal signs  Musculoskeletal:        General: No tenderness. Normal range of motion.     Cervical back: Normal range of motion and neck supple.     Comments: No edema of the legs, flexion contracture of the left upper extremity  Lymphadenopathy:     Cervical: No cervical  adenopathy.  Skin:    General: Skin is warm and dry.     Findings: No erythema or rash.  Neurological:     Mental Status: He is alert.     Coordination: Coordination normal.  Psychiatric:        Behavior: Behavior normal.     ED Results / Procedures / Treatments   Labs (all labs ordered are listed, but only abnormal results are displayed) Labs Reviewed  CBC - Abnormal; Notable for the following components:      Result Value   WBC 19.1 (*)    All other components within normal limits  BASIC METABOLIC PANEL - Abnormal; Notable for the following components:   Sodium 130 (*)    Chloride 96 (*)    Glucose, Bld 139 (*)    Calcium 8.8 (*)    All other components within normal limits  HEPATIC FUNCTION PANEL - Abnormal; Notable for the following components:   Albumin 3.2 (*)    All other components within normal limits  URINALYSIS, ROUTINE W REFLEX MICROSCOPIC - Abnormal; Notable for the following components:   Specific Gravity, Urine <1.005 (*)    Hgb urine dipstick SMALL (*)    Protein, ur 30 (*)    Nitrite POSITIVE (*)    Leukocytes,Ua SMALL (*)    Bacteria, UA MANY (*)    All other components within normal limits  SARS CORONAVIRUS 2 BY RT PCR  GROUP A STREP BY PCR  URINE CULTURE  LACTIC ACID, PLASMA  LIPASE, BLOOD    EKG None  Radiology CT ABDOMEN PELVIS W CONTRAST  Result Date: 03/29/2023 CLINICAL DATA:  Acute nonlocalized abdominal pain EXAM: CT ABDOMEN AND PELVIS WITH CONTRAST TECHNIQUE: Multidetector CT imaging of the abdomen and pelvis was performed using the standard protocol following bolus administration of intravenous contrast. RADIATION DOSE REDUCTION: This exam was performed according to the departmental dose-optimization program which includes automated exposure control, adjustment of the mA and/or kV according to patient size and/or use of iterative reconstruction technique. CONTRAST:  OMNIPAQUE IOHEXOL 300 MG/ML  SOLN COMPARISON:  None Available.  FINDINGS: Lower chest: No acute abnormality. Hepatobiliary: No focal liver abnormality is seen. No gallstones, gallbladder wall thickening, or biliary dilatation. Pancreas: Unremarkable. No pancreatic ductal dilatation or surrounding inflammatory changes. 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 Adrenals/Urinary Tract: The adrenal glands are unremarkable. The kidneys are normal in position. There is mild asymmetric enlargement of the left kidney with  heterogeneous cortical enhancement suggesting a underlying inflammatory process, most commonly pyelonephritis. No perinephric inflammatory stranding or fluid collections are identified. 13 mm nonobstructing calculus is seen within the left renal pelvis. No ureteral calculi. No hydronephrosis. Normal cortical enhancement of the right kidney. The bladder is unremarkable. Stomach/Bowel: Moderate colonic stool burden without evidence of obstruction. The stomach, small bowel, and large bowel are otherwise unremarkable. Appendix normal. No free intraperitoneal gas or fluid. Vascular/Lymphatic: Shotty left periaortic adenopathy may be reactive in nature. No frankly pathologic adenopathy within the abdomen and pelvis. The abdominal vasculature is unremarkable. Reproductive: Prostate is unremarkable. Other: No abdominal wall hernia Musculoskeletal: No acute bone abnormality. No lytic or blastic bone lesion. 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 IMPRESSION: 1. Mild asymmetric enlargement of the left kidney with heterogeneous cortical enhancement suggesting a underlying inflammatory process, most commonly pyelonephritis. No perinephric inflammatory stranding or fluid collections are identified. Correlation with urinalysis and urine culture is recommended. 2. 13 mm nonobstructing calculus within the left renal pelvis. No ureteral calculi. No hydronephrosis. 3. Moderate colonic stool burden without  evidence of obstruction. 4. Status post splenectomy.  Multiple residual splenules noted. 5. 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 Electronically Signed   By: Helyn Numbers M.D.   On: 03/29/2023 17:51   DG Chest 2 View  Result Date: 03/29/2023 CLINICAL DATA:  Headache, history of PE EXAM: CHEST - 2 VIEW COMPARISON:  Chest x-ray August 16, 2018 FINDINGS: The cardiomediastinal silhouette is unchanged in contour. Low lung volumes with bronchovascular crowding. No focal pulmonary opacity. No pleural effusion or pneumothorax. The visualized upper abdomen is unremarkable. No acute osseous abnormality. IMPRESSION: No active cardiopulmonary disease. Electronically Signed   By: Jacob Moores M.D.   On: 03/29/2023 15:53    Procedures Procedures    Medications Ordered in ED Medications  cefTRIAXone (ROCEPHIN) 1 g in sodium chloride 0.9 % 100 mL IVPB (1 g Intravenous New Bag/Given 03/29/23 1932)  dantrolene (DANTRIUM) capsule 50 mg (has no administration in time range)  acetaminophen (TYLENOL) tablet 650 mg (650 mg Oral Given 03/29/23 1637)  iohexol (OMNIPAQUE) 300 MG/ML solution 100 mL (100 mLs Intravenous Contrast Given 03/29/23 1725)    ED Course/ Medical Decision Making/ A&P                             Medical Decision Making Amount and/or Complexity of Data Reviewed Labs: ordered. Radiology: ordered.  Risk OTC drugs. Prescription drug management.    This patient presents to the ED for concern of fever headache abdominal pain, this involves an extensive number of treatment options, and is a complaint that carries with it a high risk of complications and morbidity.  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  The pertinent results include: Leukocytosis of 19,000, urinalysis shows infection   Imaging Studies ordered:  I ordered imaging studies including CT scan of the abdomen and pelvis I independently visualized and interpreted imaging which showed likely pyelonephritis, no abscess I  agree with the radiologist interpretation   Cardiac Monitoring: / EKG:  The patient was maintained on a cardiac monitor.  I personally viewed and interpreted the cardiac monitored which showed an underlying rhythm of: Initially had some tachycardia but this completely resolved and pulse is now 89, fever resolved as well    Problem List / ED Course / Critical interventions / Medication management  Rocephin given, home with cephalexin I ordered medication including cephalexin for UTI Reevaluation of the patient after these medicines showed that the patient improved I have reviewed the patients home medicines and have made adjustments as needed   Social Determinants of Health:  Traumatic brain injury   Test / Admission - Considered:  Considered admission but the patient improved, does not meet sepsis criteria at this time, well-appearing, agreeable to the plan I have discussed with the patient at the bedside the results, and the meaning of these results.  They have expressed her understanding to the need for follow-up with primary care physician          Final Clinical Impression(s) / ED Diagnoses Final diagnoses:  Pyelonephritis    Rx / DC Orders ED Discharge Orders          Ordered    cephALEXin (KEFLEX) 500 MG capsule  3 times daily        03/29/23 1958              Eber Hong, MD 03/29/23 2000

## 2023-03-29 NOTE — Discharge Instructions (Signed)
Cephalexin 3 times a day for 7 days, this will treat the kidney infection, if a urinary culture shows that you need a different antibiotic we will call 1 in.  We will also call to let you know.  If you are developing worsening symptoms including fevers pain or vomiting return to the ER immediately.  Thank you for allowing [place] to treat you in the emergency department today.  After reviewing your examination and potential testing that was done it appears that you are safe to go home.  I would like for you to follow-up with your doctor within the next several days, have them obtain your results and follow-up with them to review all of these tests.  If you should develop severe or worsening symptoms return to the emergency department immediately

## 2023-03-29 NOTE — ED Notes (Signed)
AC notified about dantrolene.

## 2023-04-01 LAB — URINE CULTURE: Culture: >=100000 — AB

## 2023-04-02 ENCOUNTER — Telehealth (HOSPITAL_BASED_OUTPATIENT_CLINIC_OR_DEPARTMENT_OTHER): Payer: Self-pay | Admitting: *Deleted

## 2023-04-02 NOTE — Telephone Encounter (Signed)
Post ED Visit - Positive Culture Follow-up  Culture report reviewed by antimicrobial stewardship pharmacist: [facility] []  Enzo Bi, Pharm.D. []  Celedonio Miyamoto, Pharm.D., BCPS AQ-ID []  Garvin Fila, Pharm.D., BCPS []  Georgina Pillion, Pharm.D., BCPS []  [place], [address].D., BCPS, AAHIVP []  Estella Husk, Pharm.D., BCPS, AAHIVP []  Lysle Pearl, PharmD, BCPS []  Phillips Climes, PharmD, BCPS []  Agapito Games, PharmD, BCPS []  Verlan Friends, PharmD []  Mervyn Gay, PharmD, BCPS [x]  Wilburn Cornelia, PharmD  [facility] []  Len Childs, PharmD []  Greer Pickerel, PharmD []  Adalberto Cole, PharmD []  Perlie Gold, Rph []  Lonell Face) Jean Rosenthal, PharmD []  Earl Many, PharmD []  Junita Push, PharmD []  Dorna Leitz, PharmD []  Terrilee Files, PharmD []  Lynann Beaver, PharmD []  Keturah Barre, PharmD []  Loralee Pacas, PharmD []  Bernadene Person, PharmD   Positive urine culture Treated with Cephalexin, organism sensitive to the same and no further patient follow-up is required at this time.  Patsey Berthold 04/02/2023, 1:00 PM
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
## 2024-07-05 ENCOUNTER — Emergency Department (HOSPITAL_COMMUNITY)
Admission: EM | Admit: 2024-07-05 | Discharge: 2024-07-05 | Disposition: A | Discharge status: Home / Self Care | Attending: Emergency Medicine | Admitting: Emergency Medicine

## 2024-07-05 ENCOUNTER — Other Ambulatory Visit: Payer: Self-pay

## 2024-07-05 ENCOUNTER — Encounter (HOSPITAL_COMMUNITY): Payer: Self-pay

## 2024-07-05 ENCOUNTER — Emergency Department (HOSPITAL_COMMUNITY)

## 2024-07-05 DIAGNOSIS — E860 Dehydration: Secondary | ICD-10-CM | POA: Insufficient documentation

## 2024-07-05 DIAGNOSIS — N390 Urinary tract infection, site not specified: Secondary | ICD-10-CM | POA: Insufficient documentation

## 2024-07-05 DIAGNOSIS — R451 Restlessness and agitation: Secondary | ICD-10-CM | POA: Insufficient documentation

## 2024-07-05 DIAGNOSIS — Z7901 Long term (current) use of anticoagulants: Secondary | ICD-10-CM | POA: Insufficient documentation

## 2024-07-05 LAB — COMPREHENSIVE METABOLIC PANEL WITH GFR
ALT: 52 U/L — ABNORMAL HIGH (ref 0–44)
AST: 42 U/L — ABNORMAL HIGH (ref 15–41)
Albumin: 4.3 g/dL (ref 3.5–5.0)
Alkaline Phosphatase: 100 U/L (ref 38–126)
Anion gap: 13 (ref 5–15)
BUN: 24 mg/dL — ABNORMAL HIGH (ref 6–20)
CO2: 22 mmol/L (ref 22–32)
Calcium: 9.5 mg/dL (ref 8.9–10.3)
Chloride: 97 mmol/L — ABNORMAL LOW (ref 98–111)
Creatinine, Ser: 1.5 mg/dL — ABNORMAL HIGH (ref 0.61–1.24)
GFR, Estimated: >60 mL/min (ref >60)
Glucose, Bld: 103 mg/dL — ABNORMAL HIGH (ref 70–99)
Potassium: 3.7 mmol/L (ref 3.5–5.1)
Sodium: 132 mmol/L — ABNORMAL LOW (ref 135–145)
Total Bilirubin: 1 mg/dL (ref 0.0–1.2)
Total Protein: 7.4 g/dL (ref 6.5–8.1)

## 2024-07-05 LAB — CBC WITH DIFFERENTIAL/PLATELET
Abs Immature Granulocytes: 0.37 K/uL — ABNORMAL HIGH (ref 0.00–0.07)
Basophils Absolute: 0 K/uL (ref 0.0–0.1)
Basophils Relative: 0 %
Eosinophils Absolute: 0 K/uL (ref 0.0–0.5)
Eosinophils Relative: 0 %
HCT: 40.5 % (ref 39.0–52.0)
Hemoglobin: 13.7 g/dL (ref 13.0–17.0)
Immature Granulocytes: 1 %
Lymphocytes Relative: 5 %
Lymphs Abs: 1.4 K/uL (ref 0.7–4.0)
MCH: 31.6 pg (ref 26.0–34.0)
MCHC: 33.8 g/dL (ref 30.0–36.0)
MCV: 93.3 fL (ref 80.0–100.0)
Monocytes Absolute: 1.1 K/uL — ABNORMAL HIGH (ref 0.1–1.0)
Monocytes Relative: 4 %
Neutro Abs: 22.7 K/uL — ABNORMAL HIGH (ref 1.7–7.7)
Neutrophils Relative %: 90 %
Platelets: 280 K/uL (ref 150–400)
RBC: 4.34 MIL/uL (ref 4.22–5.81)
RDW: 13.2 % (ref 11.5–15.5)
Smear Review: NORMAL
WBC: 25.6 K/uL — ABNORMAL HIGH (ref 4.0–10.5)
nRBC: 0 % (ref 0.0–0.2)

## 2024-07-05 LAB — RESP PANEL BY RT-PCR (RSV, FLU A&B, COVID)  RVPGX2
Influenza A by PCR: NEGATIVE
Influenza B by PCR: NEGATIVE
Resp Syncytial Virus by PCR: NEGATIVE
SARS Coronavirus 2 by RT PCR: NEGATIVE

## 2024-07-05 LAB — URINALYSIS, W/ REFLEX TO CULTURE (INFECTION SUSPECTED)
Bilirubin Urine: NEGATIVE
Glucose, UA: NEGATIVE mg/dL
Ketones, ur: 20 mg/dL — AB
Nitrite: POSITIVE — AB
Protein, ur: 100 mg/dL — AB
Specific Gravity, Urine: 1.031 — ABNORMAL HIGH (ref 1.005–1.030)
WBC, UA: >50 WBC/hpf (ref 0–5)
pH: 5 (ref 5.0–8.0)

## 2024-07-05 LAB — VALPROIC ACID LEVEL: Valproic Acid Lvl: 24 ug/mL — ABNORMAL LOW (ref 50–100)

## 2024-07-05 LAB — LACTIC ACID, PLASMA: Lactic Acid, Venous: 0.8 mmol/L (ref 0.5–1.9)

## 2024-07-05 MED ORDER — QUETIAPINE FUMARATE 25 MG PO TABS
150.0000 mg | ORAL_TABLET | Freq: Once | ORAL | Status: AC
  Administered 2024-07-05: 100 mg via ORAL
  Filled 2024-07-05: qty 2

## 2024-07-05 MED ORDER — SODIUM CHLORIDE 0.9 % IV BOLUS
1000.0000 mL | Freq: Once | INTRAVENOUS | Status: AC
  Administered 2024-07-05: 1000 mL via INTRAVENOUS

## 2024-07-05 MED ORDER — MIDAZOLAM HCL (PF) 2 MG/2ML IJ SOLN
2.0000 mg | Freq: Once | INTRAMUSCULAR | Status: AC
  Administered 2024-07-05: 2 mg via INTRAVENOUS
  Filled 2024-07-05: qty 2

## 2024-07-05 MED ORDER — DIVALPROEX SODIUM 250 MG PO DR TAB
500.0000 mg | DELAYED_RELEASE_TABLET | Freq: Once | ORAL | Status: AC
  Administered 2024-07-05: 500 mg via ORAL
  Filled 2024-07-05: qty 2

## 2024-07-05 MED ORDER — TIZANIDINE HCL 4 MG PO TABS
2.0000 mg | ORAL_TABLET | Freq: Once | ORAL | Status: AC
  Administered 2024-07-05: 2 mg via ORAL
  Filled 2024-07-05: qty 1

## 2024-07-05 MED ORDER — CEPHALEXIN 500 MG PO CAPS: 500.0000 mg | ORAL_CAPSULE | Freq: Four times a day (QID) | ORAL | 0 refills | Status: DC

## 2024-07-05 MED ORDER — DANTROLENE SODIUM 100 MG PO CAPS
100.0000 mg | ORAL_CAPSULE | Freq: Once | ORAL | Status: DC
  Filled 2024-07-05: qty 1

## 2024-07-05 MED ORDER — SODIUM CHLORIDE 0.9 % IV SOLN
2.0000 g | Freq: Once | INTRAVENOUS | Status: AC
  Administered 2024-07-05: 2 g via INTRAVENOUS
  Filled 2024-07-05: qty 20

## 2024-07-05 NOTE — ED Notes (Signed)
 Patient's family states that he takes Quetiapine  (Seroquel ) 100 mg and Divalproex  (Depakote ) 500 mg at home.

## 2024-07-05 NOTE — ED Triage Notes (Signed)
 Pt arrived via POV from home c/o hypotension at home reporting a reading of 83/43. Pts BP reading in Triage is 110/89. Pt reports frequent headaches, recent Botox injection at [facility], and decreased urinary output. Pt also reports his medications have been adjusted.

## 2024-07-05 NOTE — ED Provider Notes (Signed)
 [place] EMERGENCY DEPARTMENT AT [facility] Provider Note   CSN: [id number] Arrival date & time: 07/05/24  1505     Patient presents with: Hypotension   Charles Arroyo is a 23 y.o. male.  He has a history of traumatic brain injury and is fairly debilitated.  History is given by his family member.  He had multiple injections of Botox in his arm and leg on the left for spasms.  They also increased his dantrolene  and started him on another psychiatric medication.  This was a few days ago.  Since then he has been more sore and agitated.  Since yesterday he has not wanted to eat or drink much and has had less wet diapers.  Normally is incontinent of urine and wears diapers.  Today he felt very warm although did not have a temperature.  He has a history of sepsis due to urinary tract infection and they were concerned.  No reported dysuria.   The history is provided by the patient and a relative.  Fever Temp source:  Subjective Onset quality:  Sudden Duration:  1 day Timing:  Constant Progression:  Unchanged Chronicity:  New Associated symptoms: myalgias and somnolence   Associated symptoms: no chest pain, no cough, no diarrhea, no dysuria and no vomiting        Prior to Admission medications   Medication Sig Start Date End Date Taking? Authorizing Provider  acetaminophen  (TYLENOL ) 325 MG tablet Take 650 mg by mouth every 6 (six) hours as needed for mild pain or moderate pain (*Must be crushed).    [provider]  amitriptyline  (ELAVIL ) 10 MG tablet Take 1 tablet (10 mg total) by mouth at bedtime. 02/13/21   Babs Arthea DASEN, MD  Bisacodyl  (ALOPHEN PO) 1 tablet AS NEEDED (route: oral) 08/23/19   [provider]  dantrolene  (DANTRIUM ) 50 MG capsule TAKE 1 CAPSULE BY MOUTH FOUR TIMES A DAY. Patient taking differently: Take 100 mg by mouth 3 (three) times daily. 11/18/20   Babs Arthea DASEN, MD  divalproex  (DEPAKOTE ) 250 MG DR tablet Take 250 mg by mouth every  morning.    [provider]  divalproex  (DEPAKOTE ) 500 MG DR tablet Take 1 tablet (500 mg total) by mouth 2 (two) times daily. 04/16/21   Babs Arthea DASEN, MD  HYDROcodone -acetaminophen  (NORCO) 10-325 MG tablet Take 1 tablet by mouth every 6 (six) hours as needed. 05/13/21   Babs Arthea DASEN, MD  Magnesium 100 MG CAPS Take by mouth at bedtime.    [provider]  polyethylene glycol (MIRALAX  / GLYCOLAX ) 17 g packet Take 17 g by mouth daily.    [provider]  QUEtiapine  (SEROQUEL ) 50 MG tablet Take 150 mg by mouth at bedtime.    [provider]  XARELTO  10 MG TABS tablet Take 1 tablet (10 mg total) by mouth daily. 01/21/21   Babs Arthea DASEN, MD    Allergies: Patient has no known allergies.    Review of Systems  Constitutional:  Positive for fever.  Respiratory:  Negative for cough.   Cardiovascular:  Negative for chest pain.  Gastrointestinal:  Negative for diarrhea and vomiting.  Genitourinary:  Negative for dysuria.  Musculoskeletal:  Positive for myalgias.    Updated Vital Signs BP 120/60   Pulse 89   Temp 98 F (36.7 C) (Temporal)   Resp 20   Ht 5' 11 (1.803 m)   Wt 83.9 kg   SpO2 94%   BMI 25.80 kg/m  Physical Exam Vitals and nursing note reviewed.  Constitutional:      General: He is not in acute distress.    Appearance: Normal appearance. He is well-developed.  HENT:     Head: Normocephalic and atraumatic.  Eyes:     Conjunctiva/sclera: Conjunctivae normal.  Cardiovascular:     Rate and Rhythm: Normal rate and regular rhythm.     Heart sounds: No murmur heard. Pulmonary:     Effort: Pulmonary effort is normal. No respiratory distress.     Breath sounds: Normal breath sounds.  Abdominal:     Palpations: Abdomen is soft.     Tenderness: There is no abdominal tenderness. There is no guarding or rebound.  Musculoskeletal:        General: No swelling.     Cervical back: Neck supple.  Skin:    General: Skin is warm and dry.      Capillary Refill: Capillary refill takes less than 2 seconds.  Neurological:     Mental Status: He is alert.     Comments: He has some contractures in his arms and legs.  He is however able to move everything.  He is able to follow commands     (all labs ordered are listed, but only abnormal results are displayed) Labs Reviewed  CBC WITH DIFFERENTIAL/PLATELET - Abnormal; Notable for the following components:      Result Value   WBC 25.6 (*)    Neutro Abs 22.7 (*)    Monocytes Absolute 1.1 (*)    Abs Immature Granulocytes 0.37 (*)    All other components within normal limits  COMPREHENSIVE METABOLIC PANEL WITH GFR - Abnormal; Notable for the following components:   Sodium 132 (*)    Chloride 97 (*)    Glucose, Bld 103 (*)    BUN 24 (*)    Creatinine, Ser 1.50 (*)    AST 42 (*)    ALT 52 (*)    All other components within normal limits  URINALYSIS, W/ REFLEX TO CULTURE (INFECTION SUSPECTED) - Abnormal; Notable for the following components:   Color, Urine AMBER (*)    APPearance HAZY (*)    Specific Gravity, Urine 1.031 (*)    Hgb urine dipstick SMALL (*)    Ketones, ur 20 (*)    Protein, ur 100 (*)    Nitrite POSITIVE (*)    Leukocytes,Ua MODERATE (*)    Bacteria, UA MANY (*)    All other components within normal limits  VALPROIC ACID LEVEL - Abnormal; Notable for the following components:   Valproic Acid Lvl 24 (*)    All other components within normal limits  CULTURE, BLOOD (ROUTINE X 2)  CULTURE, BLOOD (ROUTINE X 2)  RESP PANEL BY RT-PCR (RSV, FLU A&B, COVID)  [id number]  URINE CULTURE  LACTIC ACID, PLASMA    EKG: EKG Interpretation Date/Time:  Thursday July 05 2024 18:11:34 EDT Ventricular Rate:  92 PR Interval:  141 QRS Duration:  125 QT Interval:  363 QTC Calculation: 449 R Axis:   13  Text Interpretation: Sinus rhythm Right bundle branch block No significant change since prior 12/20 Confirmed by Towana Sharper [phone]) on 07/05/2024 6:14:28  PM  Radiology: [facility] Chest Port 1 View Result Date: 07/05/2024 EXAM: 1 VIEW XRAY OF THE CHEST 07/05/2024 07:09:26 PM COMPARISON: Abdominal series x-ray 792/000/24. CLINICAL HISTORY: fever. Fever, low blood pressure. FINDINGS: LUNGS AND PLEURA: No focal pulmonary opacity. No pulmonary edema. No pleural effusion. No pneumothorax. HEART AND MEDIASTINUM: The heart is  mildly enlarged, unchanged. BONES AND SOFT TISSUES: No acute osseous abnormality. IMPRESSION: 1. No acute findings. 2. Stable mild cardiomegaly Electronically signed by: Greig Pique MD 07/05/2024 07:26 PM EDT RP Workstation: [id number]     Procedures   Medications Ordered in the ED  sodium chloride  0.9 % bolus 1,000 mL (0 mLs Intravenous Stopped 07/05/24 2233)  QUEtiapine  (SEROQUEL ) tablet 150 mg (100 mg Oral Given 07/05/24 1943)  divalproex  (DEPAKOTE ) DR tablet 500 mg (500 mg Oral Given 07/05/24 1942)  tiZANidine (ZANAFLEX) tablet 2 mg (2 mg Oral Given 07/05/24 1947)  midazolam PF (VERSED) injection 2 mg (2 mg Intravenous Given 07/05/24 2118)  cefTRIAXone  (ROCEPHIN ) 2 g in sodium chloride  0.9 % 100 mL IVPB (0 g Intravenous Stopped 07/05/24 2247)    Clinical Course as of 07/06/24 0947  Thu Jul 05, 2024  1928 X-ray interpreted by me as no acute infiltrate.  Awaiting radiology reading. [MB]  1942 Mom asking for him to get his evening meds to make his workup go a little easier. [MB]    Clinical Course User Index [MB] Towana Ozell BROCKS, MD                                 Medical Decision Making Amount and/or Complexity of Data Reviewed Labs: ordered. Radiology: ordered.  Risk Prescription drug management.   This patient complains of increased agitation; this involves an extensive number of treatment Options and is a complaint that carries with it a high risk of complications and morbidity. The differential includes infection including UTI, sepsis, medication side effect  I ordered, reviewed and interpreted labs, which  included CBC with elevated white count, chemistries with AKI, urinalysis concerning for infection, blood culture sent, lactate normal, urine culture sent, COVID and flu negative I ordered medication IV fluids IV antibiotics and reviewed PMP when indicated. I ordered imaging studies which included chest x-ray and I independently    visualized and interpreted imaging which showed no acute findings Additional history obtained from patient's family members Previous records obtained and reviewed in epic no recent admissions Cardiac monitoring reviewed, sinus rhythm sinus tachycardia Social determinants considered, no significant barriers Critical Interventions: None  After the interventions stated above, I reevaluated the patient and found patient to be hemodynamically stable and nontoxic Admission and further testing considered, family is comfortable taking him home.  Will call in prescription for antibiotics.  Recommended close follow-up with his treatment team at the [facility].  Return instructions discussed      Final diagnoses:  Dehydration  Agitation  Lower urinary tract infectious disease    ED Discharge Orders          Ordered    cephALEXin  (KEFLEX ) 500 MG capsule  4 times daily        07/05/24 2232               Towana Ozell BROCKS, MD 07/06/24 [phone]

## 2024-07-05 NOTE — Discharge Instructions (Addendum)
 Your lab work showed signs of dehydration and your urinalysis was concerning for infection.  You were given IV antibiotics.  Please finish your oral antibiotics and follow-up with your primary care doctor.  Return to the emergency department if any worsening or concerning symptoms.

## 2024-07-06 ENCOUNTER — Emergency Department (HOSPITAL_COMMUNITY)

## 2024-07-06 ENCOUNTER — Other Ambulatory Visit: Payer: Self-pay

## 2024-07-06 ENCOUNTER — Encounter (HOSPITAL_COMMUNITY): Payer: Self-pay | Admitting: Emergency Medicine

## 2024-07-06 ENCOUNTER — Inpatient Hospital Stay (HOSPITAL_COMMUNITY)
Admission: EM | Admit: 2024-07-06 | Discharge: 2024-07-08 | DRG: 690 | Disposition: A | Discharge status: Home / Self Care | Attending: Internal Medicine | Admitting: Internal Medicine

## 2024-07-06 DIAGNOSIS — I69898 Other sequelae of other cerebrovascular disease: Secondary | ICD-10-CM | POA: Diagnosis not present

## 2024-07-06 DIAGNOSIS — R4189 Other symptoms and signs involving cognitive functions and awareness: Secondary | ICD-10-CM | POA: Diagnosis present

## 2024-07-06 DIAGNOSIS — Z79899 Other long term (current) drug therapy: Secondary | ICD-10-CM

## 2024-07-06 DIAGNOSIS — B9689 Other specified bacterial agents as the cause of diseases classified elsewhere: Secondary | ICD-10-CM | POA: Diagnosis present

## 2024-07-06 DIAGNOSIS — B9620 Unspecified Escherichia coli [E. coli] as the cause of diseases classified elsewhere: Secondary | ICD-10-CM | POA: Diagnosis present

## 2024-07-06 DIAGNOSIS — G8114 Spastic hemiplegia affecting left nondominant side: Secondary | ICD-10-CM | POA: Diagnosis present

## 2024-07-06 DIAGNOSIS — N179 Acute kidney failure, unspecified: Secondary | ICD-10-CM | POA: Diagnosis present

## 2024-07-06 DIAGNOSIS — Z87891 Personal history of nicotine dependence: Secondary | ICD-10-CM | POA: Diagnosis not present

## 2024-07-06 DIAGNOSIS — Z7901 Long term (current) use of anticoagulants: Secondary | ICD-10-CM | POA: Diagnosis not present

## 2024-07-06 DIAGNOSIS — E860 Dehydration: Secondary | ICD-10-CM | POA: Diagnosis present

## 2024-07-06 DIAGNOSIS — Z931 Gastrostomy status: Secondary | ICD-10-CM

## 2024-07-06 DIAGNOSIS — R252 Cramp and spasm: Secondary | ICD-10-CM | POA: Diagnosis present

## 2024-07-06 DIAGNOSIS — Z86718 Personal history of other venous thrombosis and embolism: Secondary | ICD-10-CM | POA: Diagnosis not present

## 2024-07-06 DIAGNOSIS — R7881 Bacteremia: Secondary | ICD-10-CM | POA: Diagnosis present

## 2024-07-06 DIAGNOSIS — G40909 Epilepsy, unspecified, not intractable, without status epilepticus: Secondary | ICD-10-CM | POA: Diagnosis present

## 2024-07-06 DIAGNOSIS — N136 Pyonephrosis: Secondary | ICD-10-CM | POA: Diagnosis present

## 2024-07-06 DIAGNOSIS — R32 Unspecified urinary incontinence: Secondary | ICD-10-CM | POA: Diagnosis present

## 2024-07-06 DIAGNOSIS — Z1152 Encounter for screening for COVID-19: Secondary | ICD-10-CM

## 2024-07-06 DIAGNOSIS — Z86711 Personal history of pulmonary embolism: Secondary | ICD-10-CM

## 2024-07-06 DIAGNOSIS — I959 Hypotension, unspecified: Secondary | ICD-10-CM | POA: Diagnosis present

## 2024-07-06 DIAGNOSIS — N200 Calculus of kidney: Secondary | ICD-10-CM | POA: Diagnosis present

## 2024-07-06 DIAGNOSIS — Z87820 Personal history of traumatic brain injury: Secondary | ICD-10-CM

## 2024-07-06 DIAGNOSIS — N10 Acute tubulo-interstitial nephritis: Secondary | ICD-10-CM | POA: Diagnosis not present

## 2024-07-06 LAB — BLOOD CULTURE ID PANEL (REFLEXED) - BCID2

## 2024-07-06 LAB — CBC WITH DIFFERENTIAL/PLATELET
Abs Immature Granulocytes: 0.28 K/uL — ABNORMAL HIGH (ref 0.00–0.07)
Basophils Absolute: 0 K/uL (ref 0.0–0.1)
Basophils Relative: 0 %
Eosinophils Absolute: 0 K/uL (ref 0.0–0.5)
Eosinophils Relative: 0 %
HCT: 36.3 % — ABNORMAL LOW (ref 39.0–52.0)
Hemoglobin: 12.6 g/dL — ABNORMAL LOW (ref 13.0–17.0)
Immature Granulocytes: 2 %
Lymphocytes Relative: 7 %
Lymphs Abs: 1.3 K/uL (ref 0.7–4.0)
MCH: 32 pg (ref 26.0–34.0)
MCHC: 34.7 g/dL (ref 30.0–36.0)
MCV: 92.1 fL (ref 80.0–100.0)
Monocytes Absolute: 0.9 K/uL (ref 0.1–1.0)
Monocytes Relative: 5 %
Neutro Abs: 15.5 K/uL — ABNORMAL HIGH (ref 1.7–7.7)
Neutrophils Relative %: 86 %
Platelets: 225 K/uL (ref 150–400)
RBC: 3.94 MIL/uL — ABNORMAL LOW (ref 4.22–5.81)
RDW: 13.6 % (ref 11.5–15.5)
WBC: 18 K/uL — ABNORMAL HIGH (ref 4.0–10.5)
nRBC: 0 % (ref 0.0–0.2)

## 2024-07-06 LAB — COMPREHENSIVE METABOLIC PANEL WITH GFR
ALT: 36 U/L (ref 0–44)
AST: 26 U/L (ref 15–41)
Albumin: 3.6 g/dL (ref 3.5–5.0)
Alkaline Phosphatase: 145 U/L — ABNORMAL HIGH (ref 38–126)
Anion gap: 12 (ref 5–15)
BUN: 20 mg/dL (ref 6–20)
CO2: 22 mmol/L (ref 22–32)
Calcium: 9.2 mg/dL (ref 8.9–10.3)
Chloride: 97 mmol/L — ABNORMAL LOW (ref 98–111)
Creatinine, Ser: 1.13 mg/dL (ref 0.61–1.24)
GFR, Estimated: >60 mL/min (ref >60)
Glucose, Bld: 101 mg/dL — ABNORMAL HIGH (ref 70–99)
Potassium: 4 mmol/L (ref 3.5–5.1)
Sodium: 130 mmol/L — ABNORMAL LOW (ref 135–145)
Total Bilirubin: 0.6 mg/dL (ref 0.0–1.2)
Total Protein: 6.7 g/dL (ref 6.5–8.1)

## 2024-07-06 LAB — LACTIC ACID, PLASMA: Lactic Acid, Venous: 0.9 mmol/L (ref 0.5–1.9)

## 2024-07-06 MED ORDER — SODIUM CHLORIDE 0.9 % IV SOLN
2.0000 g | Freq: Once | INTRAVENOUS | Status: AC
  Administered 2024-07-06: 2 g via INTRAVENOUS
  Filled 2024-07-06: qty 20

## 2024-07-06 MED ORDER — SODIUM CHLORIDE 0.9 % IV BOLUS
1000.0000 mL | Freq: Once | INTRAVENOUS | Status: AC
  Administered 2024-07-06: 1000 mL via INTRAVENOUS

## 2024-07-06 MED ORDER — SODIUM CHLORIDE 0.9 % IV SOLN
2.0000 g | INTRAVENOUS | Status: DC
  Administered 2024-07-07 – 2024-07-08 (×2): 2 g via INTRAVENOUS
  Filled 2024-07-06 (×2): qty 20

## 2024-07-06 MED ORDER — DIAZEPAM 2 MG PO TABS
2.0000 mg | ORAL_TABLET | Freq: Every morning | ORAL | Status: DC
  Administered 2024-07-07 – 2024-07-08 (×2): 2 mg via ORAL
  Filled 2024-07-06 (×2): qty 1

## 2024-07-06 MED ORDER — LACTATED RINGERS IV BOLUS: 1000.0000 mL | Freq: Once | INTRAVENOUS | Status: DC

## 2024-07-06 MED ORDER — POLYETHYLENE GLYCOL 3350 17 G PO PACK
17.0000 g | PACK | Freq: Every day | ORAL | Status: DC
  Administered 2024-07-06 – 2024-07-08 (×3): 17 g via ORAL
  Filled 2024-07-06 (×3): qty 1

## 2024-07-06 MED ORDER — QUETIAPINE FUMARATE 100 MG PO TABS
100.0000 mg | ORAL_TABLET | Freq: Every day | ORAL | Status: DC
  Administered 2024-07-06 – 2024-07-07 (×2): 100 mg via ORAL
  Filled 2024-07-06 (×2): qty 1

## 2024-07-06 MED ORDER — ACETAMINOPHEN 325 MG PO TABS
650.0000 mg | ORAL_TABLET | Freq: Four times a day (QID) | ORAL | Status: DC | PRN
  Administered 2024-07-07 – 2024-07-08 (×5): 650 mg via ORAL
  Filled 2024-07-06 (×5): qty 2

## 2024-07-06 MED ORDER — ENOXAPARIN SODIUM 40 MG/0.4ML IJ SOSY
40.0000 mg | PREFILLED_SYRINGE | INTRAMUSCULAR | Status: DC
  Administered 2024-07-06 – 2024-07-07 (×2): 40 mg via SUBCUTANEOUS
  Filled 2024-07-06 (×2): qty 0.4

## 2024-07-06 MED ORDER — ONDANSETRON HCL 4 MG/2ML IJ SOLN: 4.0000 mg | Freq: Four times a day (QID) | INTRAMUSCULAR | Status: DC | PRN

## 2024-07-06 MED ORDER — SODIUM CHLORIDE 0.9 % IV SOLN: INTRAVENOUS | Status: DC

## 2024-07-06 MED ORDER — ONDANSETRON HCL 4 MG PO TABS: 4.0000 mg | ORAL_TABLET | Freq: Four times a day (QID) | ORAL | Status: DC | PRN

## 2024-07-06 MED ORDER — DIVALPROEX SODIUM 250 MG PO DR TAB
250.0000 mg | DELAYED_RELEASE_TABLET | Freq: Every day | ORAL | Status: DC
  Filled 2024-07-06: qty 1

## 2024-07-06 MED ORDER — DANTROLENE SODIUM 100 MG PO CAPS
100.0000 mg | ORAL_CAPSULE | Freq: Three times a day (TID) | ORAL | Status: DC
  Administered 2024-07-07 – 2024-07-08 (×5): 100 mg via ORAL
  Filled 2024-07-06 (×7): qty 1

## 2024-07-06 MED ORDER — DULOXETINE HCL 60 MG PO CPEP
60.0000 mg | ORAL_CAPSULE | Freq: Every day | ORAL | Status: DC
Start: 2024-07-06 — End: 2024-07-09
  Administered 2024-07-07 – 2024-07-08 (×2): 60 mg via ORAL
  Filled 2024-07-06 (×3): qty 1

## 2024-07-06 MED FILL — Dantrolene Sodium Cap 25 MG: 75.0000 mg | ORAL | Qty: 3 | Status: AC

## 2024-07-06 NOTE — ED Triage Notes (Signed)
 Pt to the ED from home with complaints of abnormal lab results after being seen in the ED yesterday.  Blood cultures are positive for ESCHERICHIA COLI.

## 2024-07-06 NOTE — ED Provider Notes (Signed)
 [place] EMERGENCY DEPARTMENT AT [facility] Provider Note   CSN: [id number] Arrival date & time: 07/06/24  1456     Patient presents with: No chief complaint on file.   Charles Arroyo is a 23 y.o. male.   Patient is a 23 year old male who presents to the emergency department for reevaluation secondary to positive blood cultures which were drawn yesterday.  Mother does help provide the history noting that the patient has had worsening fatigue and malaise, fevers, generalized bodyaches, abdominal pain.  There has been no associated nausea, vomiting but he has had associated constipation.  There has been no associated syncopal events.        Prior to Admission medications   Medication Sig Start Date End Date Taking? Authorizing Provider  acetaminophen  (TYLENOL ) 325 MG tablet Take 650 mg by mouth every 6 (six) hours as needed for mild pain or moderate pain (*Must be crushed).    [provider]  cephALEXin  (KEFLEX ) 500 MG capsule Take 1 capsule (500 mg total) by mouth 4 (four) times daily. 07/05/24   Towana Ozell BROCKS, MD  dantrolene  (DANTRIUM ) 25 MG capsule Take 75 mg by mouth 3 (three) times daily. 06/26/24   [provider]  diazepam (VALIUM) 2 MG tablet Take 2 mg by mouth in the morning. 09/01/23   [provider]  divalproex  (DEPAKOTE ) 125 MG DR tablet Take 125 mg by mouth in the morning. 12/24/23   [provider]  divalproex  (DEPAKOTE ) 250 MG DR tablet Take 250 mg by mouth daily.    [provider]  DULoxetine (CYMBALTA) 60 MG capsule Take 60 mg by mouth daily. 11/24/23   [provider]  Magnesium 100 MG CAPS Take by mouth at bedtime.    [provider]  polyethylene glycol (MIRALAX  / GLYCOLAX ) 17 g packet Take 17 g by mouth daily.    [provider]  QUEtiapine  (SEROQUEL ) 100 MG tablet Take 100 mg by mouth at bedtime. 11/24/23   [provider]  tiZANidine (ZANAFLEX) 2 MG tablet Take 2 mg  by mouth 3 (three) times daily. 09/05/23   [provider]    Allergies: Patient has no known allergies.    Review of Systems  Constitutional:  Positive for fatigue and fever.  Gastrointestinal:  Positive for abdominal pain.  All other systems reviewed and are negative.   Updated Vital Signs BP 94/77 (BP Location: Right Arm)   Pulse 92   Temp (!) 97.4 F (36.3 C) (Oral)   Resp 19   Ht 5' 11 (1.803 m)   Wt 83.9 kg   SpO2 93%   BMI 25.80 kg/m   Physical Exam Vitals and nursing note reviewed.  Constitutional:      General: He is not in acute distress.    Appearance: Normal appearance. He is not ill-appearing.  HENT:     Head: Normocephalic and atraumatic.     Nose: Nose normal.     Mouth/Throat:     Mouth: Mucous membranes are moist.  Eyes:     Extraocular Movements: Extraocular movements intact.     Conjunctiva/sclera: Conjunctivae normal.     Pupils: Pupils are equal, round, and reactive to light.  Cardiovascular:     Rate and Rhythm: Normal rate and regular rhythm.     Pulses: Normal pulses.     Heart sounds: Normal heart sounds. No murmur heard.    No gallop.  Pulmonary:     Effort: Pulmonary effort is normal. No respiratory  distress.     Breath sounds: Normal breath sounds. No stridor. No wheezing, rhonchi or rales.  Abdominal:     General: Abdomen is flat. Bowel sounds are normal. There is distension.     Palpations: Abdomen is soft.     Tenderness: There is abdominal tenderness. There is no guarding.  Musculoskeletal:        General: Normal range of motion.     Cervical back: Normal range of motion and neck supple. No rigidity or tenderness.     Right lower leg: No edema.     Left lower leg: No edema.  Skin:    General: Skin is warm and dry.     Findings: No rash.  Neurological:     General: No focal deficit present.     Mental Status: He is alert and oriented to person, place, and time. Mental status is at baseline.  Psychiatric:        Mood  and Affect: Mood normal.        Behavior: Behavior normal.        Thought Content: Thought content normal.        Judgment: Judgment normal.     (all labs ordered are listed, but only abnormal results are displayed) Labs Reviewed  CULTURE, BLOOD (ROUTINE X 2)  CULTURE, BLOOD (ROUTINE X 2)  COMPREHENSIVE METABOLIC PANEL WITH GFR  LACTIC ACID, PLASMA  LACTIC ACID, PLASMA  CBC WITH DIFFERENTIAL/PLATELET    EKG: None  Radiology: [facility] Chest Port 1 View Result Date: 07/05/2024 EXAM: 1 VIEW XRAY OF THE CHEST 07/05/2024 07:09:26 PM COMPARISON: Abdominal series x-ray 792/000/24. CLINICAL HISTORY: fever. Fever, low blood pressure. FINDINGS: LUNGS AND PLEURA: No focal pulmonary opacity. No pulmonary edema. No pleural effusion. No pneumothorax. HEART AND MEDIASTINUM: The heart is mildly enlarged, unchanged. BONES AND SOFT TISSUES: No acute osseous abnormality. IMPRESSION: 1. No acute findings. 2. Stable mild cardiomegaly Electronically signed by: Greig Pique MD 07/05/2024 07:26 PM EDT RP Workstation: [id number]     Procedures   Medications Ordered in the ED  cefTRIAXone  (ROCEPHIN ) 2 g in sodium chloride  0.9 % 100 mL IVPB (has no administration in time range)  lactated ringers bolus 1,000 mL (has no administration in time range)                                    Medical Decision Making Amount and/or Complexity of Data Reviewed Labs: ordered. Radiology: ordered.  Risk Decision regarding hospitalization.   This patient presents to the ED for concern of generalized weakness, abdominal pain, dysuria, fever differential diagnosis includes sepsis, pneumonia, urinary tract infection, bacteremia    Additional history obtained:  Additional history obtained from family External records from outside source obtained and reviewed including medical records   Lab Tests:  I Ordered, and personally interpreted labs.  The pertinent results include: Leukocytosis, anemia at baseline, normal  kidney function and liver function, hyponatremia, normal lactic acid   Imaging Studies ordered:  I ordered imaging studies including CT scan abdomen and pelvis I independently visualized and interpreted imaging which showed findings consistent with left-sided pyelonephritis I agree with the radiologist interpretation   Medicines ordered and prescription drug management:  I ordered medication including IV fluids, Rocephin  for gram-negative bacteremia Reevaluation of the patient after these medicines showed that the patient improved I have reviewed the patients home medicines and have made adjustments as needed   Problem List /  ED Course:  Patient is doing well at this time and does remain stable.  Have discussed patient case with Dr. Barbra with the hospitalist service for admission given the patient's gram-negative bacteremia.  I did look at his last urine culture which was sensitive to Rocephin  with the E. coli that was grown at that time.  Did start patient on Rocephin  in the emergency department as well as provide IV fluids.  He has no lactic acidosis at this point.  He does have an associated leukocytosis.  No other acute surgical process was noted on CT scan of the abdomen and pelvis.   Social Determinants of Health:  None        Final diagnoses:  None    ED Discharge Orders     None          Daralene Lonni JONETTA DEVONNA 07/06/24 1835    Towana Ozell BROCKS, MD 07/07/24 1019

## 2024-07-06 NOTE — H&P (Signed)
 History and Physical    Patient: Charles Arroyo FMW:[id number] DOB: Apr 14, 2001 DOA: 07/06/2024 DOS: the patient was seen and examined on 07/06/2024 PCP: Lenon Layman ORN, MD  Patient coming from: Home  Chief Complaint: No chief complaint on file.  HPI: Charles Arroyo is a 23 y.o. male with medical history significant of TBI with left-sided spastic paralysis, history of DVT/PE.  Patient seen due to positive blood cultures that were obtained in the emergency department yesterday.  Since Wednesday, the patient has been having increased fatigue, irritability, complaints of left flank pain.  He was seen in the emergency department and deemed appropriate to be discharged home.  His family was called today and notified of the positive blood cultures and was asked to return.  He has been having continued left flank pain, agitation, decreased appetite both oral food and liquid.  No fevers or chills  Here, white blood count was 18.  His CT showed evidence of pyelonephritis.  Review of Systems: As mentioned in the history of present illness. All other systems reviewed and are negative. 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 His smoking use included cigarettes. He started smoking about 11 years ago. He has a 2.5 pack-year smoking history. He has been exposed to tobacco smoke. He has quit using smokeless tobacco. He reports that he does not drink alcohol and does not use drugs.  No Known Allergies  History reviewed. No pertinent family history.  Prior to Admission medications   Medication Sig Start Date End Date Taking? Authorizing Provider  acetaminophen  (TYLENOL ) 325 MG tablet Take 650 mg  by mouth every 6 (six) hours as needed for mild pain or moderate pain (*Must be crushed).    [provider]  cephALEXin  (KEFLEX ) 500 MG capsule Take 1 capsule (500 mg total) by mouth 4 (four) times daily. 07/05/24   Towana Ozell BROCKS, MD  dantrolene  (DANTRIUM ) 25 MG capsule Take 75 mg by mouth 3 (three) times daily. 06/26/24   [provider]  diazepam (VALIUM) 2 MG tablet Take 2 mg by mouth in the morning. 09/01/23   [provider]  divalproex  (DEPAKOTE ) 125 MG DR tablet Take 125 mg by mouth in the morning. 12/24/23   [provider]  divalproex  (DEPAKOTE ) 250 MG DR tablet Take 250 mg by mouth daily.    [provider]  DULoxetine (CYMBALTA) 60 MG capsule Take 60 mg by mouth daily. 11/24/23   [provider]  Magnesium 100 MG CAPS Take by mouth at bedtime.    [provider]  polyethylene glycol (MIRALAX  / GLYCOLAX ) 17 g packet Take 17 g by mouth daily.    [provider]  QUEtiapine  (SEROQUEL ) 100 MG tablet Take 100 mg by mouth at bedtime. 11/24/23   [provider]  tiZANidine (ZANAFLEX) 2 MG tablet Take 2 mg by mouth 3 (three) times daily. 09/05/23   [provider]    Physical Exam: Vitals:   07/06/24 1800 07/06/24 1830 07/06/24 1845 07/06/24 2024  BP: 120/83 125/74 112/80 113/74  Pulse:    (!) 101  Resp:    16  Temp:    ROLLEN)  97.5 F (36.4 C)  TempSrc:    Oral  SpO2:    97%  Weight:    93.5 kg  Height:       General: Young male. Awake and alert and oriented x3. No acute cardiopulmonary distress.  HEENT: Normocephalic atraumatic.  Right and left ears normal in appearance.  Pupils equal, round, reactive to light. Extraocular muscles are intact. Sclerae anicteric and noninjected.  Moist mucosal membranes. No mucosal lesions.  Neck: Neck supple without lymphadenopathy. No carotid bruits. No masses palpated.  Cardiovascular: Regular rate with normal S1-S2 sounds. No murmurs, rubs, gallops auscultated. No  JVD.  Respiratory: Good respiratory effort with no wheezes, rales, rhonchi. Lungs clear to auscultation bilaterally.  No accessory muscle use. Abdomen: Soft, nontender, nondistended. Active bowel sounds. No masses or hepatosplenomegaly  Skin: No rashes, lesions, or ulcerations.  Dry, warm to touch. 2+ dorsalis pedis and radial pulses. Musculoskeletal: No calf or leg pain. All major joints not erythematous nontender.  No upper or lower joint deformation.  Good ROM.  No contractures  Psychiatric: Intact judgment and insight. Pleasant and cooperative. Neurologic: Left-sided hemiparesis with contractures.  Cranial nerves II through XII are grossly intact.   Data Reviewed: Labs and imaging reviewed by me  Assessment and Plan: No notes have been filed under this hospital service. 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 Coli Start Rocephin  Recheck blood culture Urine culture obtained CBC in the morning  TBI with spastic hemiparesis Continue muscle relaxants Seizure disorder Continue antiepileptics   Advance Care Planning:   Code Status: Full Code confirmed by patient and family  Consults: None  Family Communication: Mother and stepfather are in the room during interview and exam  Severity of Illness: The appropriate patient status for this patient is INPATIENT. Inpatient status is judged to be reasonable and necessary in order to provide the required intensity of service to ensure the patient's safety. The patient's presenting symptoms, physical exam findings, and initial radiographic and laboratory data in the context of their chronic comorbidities is felt to place them at high risk for further clinical deterioration. Furthermore, it is not  anticipated that the patient will be medically stable for discharge from the hospital within 2 midnights of admission.   * I certify that at the point of admission it is my clinical judgment that the patient will require inpatient hospital care spanning beyond 2 midnights from the point of admission due to high intensity of service, high risk for further deterioration and high frequency of surveillance required.*  Author: Garnet Chatmon J Meilani Edmundson, DO 07/06/2024 9:05 PM  For on call review [email or web address].

## 2024-07-07 ENCOUNTER — Inpatient Hospital Stay (HOSPITAL_COMMUNITY)

## 2024-07-07 DIAGNOSIS — R7881 Bacteremia: Secondary | ICD-10-CM | POA: Insufficient documentation

## 2024-07-07 DIAGNOSIS — N10 Acute tubulo-interstitial nephritis: Secondary | ICD-10-CM | POA: Diagnosis not present

## 2024-07-07 DIAGNOSIS — G8114 Spastic hemiplegia affecting left nondominant side: Secondary | ICD-10-CM

## 2024-07-07 LAB — CBC
HCT: 30.8 % — ABNORMAL LOW (ref 39.0–52.0)
Hemoglobin: 10.7 g/dL — ABNORMAL LOW (ref 13.0–17.0)
MCH: 31.8 pg (ref 26.0–34.0)
MCHC: 34.7 g/dL (ref 30.0–36.0)
MCV: 91.7 fL (ref 80.0–100.0)
Platelets: 191 K/uL (ref 150–400)
RBC: 3.36 MIL/uL — ABNORMAL LOW (ref 4.22–5.81)
RDW: 13.7 % (ref 11.5–15.5)
WBC: 14 K/uL — ABNORMAL HIGH (ref 4.0–10.5)
nRBC: 0 % (ref 0.0–0.2)

## 2024-07-07 LAB — BASIC METABOLIC PANEL WITH GFR
Anion gap: 8 (ref 5–15)
BUN: 14 mg/dL (ref 6–20)
CO2: 22 mmol/L (ref 22–32)
Calcium: 8.3 mg/dL — ABNORMAL LOW (ref 8.9–10.3)
Chloride: 100 mmol/L (ref 98–111)
Creatinine, Ser: 0.72 mg/dL (ref 0.61–1.24)
GFR, Estimated: >60 mL/min (ref >60)
Glucose, Bld: 99 mg/dL (ref 70–99)
Potassium: 3.6 mmol/L (ref 3.5–5.1)
Sodium: 131 mmol/L — ABNORMAL LOW (ref 135–145)

## 2024-07-07 LAB — HIV ANTIBODY (ROUTINE TESTING W REFLEX): HIV Screen 4th Generation wRfx: NONREACTIVE

## 2024-07-07 MED ORDER — DIVALPROEX SODIUM 250 MG PO DR TAB
250.0000 mg | DELAYED_RELEASE_TABLET | Freq: Every day | ORAL | Status: DC
  Administered 2024-07-07: 250 mg via ORAL
  Filled 2024-07-07: qty 1

## 2024-07-07 MED ORDER — DIVALPROEX SODIUM 125 MG PO DR TAB
125.0000 mg | DELAYED_RELEASE_TABLET | Freq: Every morning | ORAL | Status: DC
  Administered 2024-07-07 – 2024-07-08 (×2): 125 mg via ORAL
  Filled 2024-07-07 (×3): qty 1

## 2024-07-07 MED ORDER — TIZANIDINE HCL 2 MG PO TABS
2.0000 mg | ORAL_TABLET | Freq: Three times a day (TID) | ORAL | Status: DC
  Administered 2024-07-07 – 2024-07-08 (×5): 2 mg via ORAL
  Filled 2024-07-07 (×5): qty 1

## 2024-07-07 MED ORDER — POTASSIUM CHLORIDE IN NACL 20-0.9 MEQ/L-% IV SOLN: INTRAVENOUS | Status: AC

## 2024-07-07 MED ORDER — DIAZEPAM 2 MG PO TABS
2.0000 mg | ORAL_TABLET | Freq: Once | ORAL | Status: AC
  Administered 2024-07-07: 2 mg via ORAL
  Filled 2024-07-07: qty 1

## 2024-07-07 NOTE — Progress Notes (Signed)
 Pts mother brought in Dantrium  medication from home, our pharmacy does not carry this medication. Med counted and placed in pts med drawer with form, until pharmacy arrives this morning to label and check in medication. 146 capsules total.

## 2024-07-07 NOTE — Plan of Care (Signed)
  Problem: Education: Goal: Knowledge of General Education information will improve Description: Including pain rating scale, medication(s)/side effects and non-pharmacologic comfort measures Outcome: Progressing   Problem: Clinical Measurements: Goal: Diagnostic test results will improve Outcome: Progressing   Problem: Coping: Goal: Level of anxiety will decrease Outcome: Progressing   Problem: Pain Managment: Goal: General experience of comfort will improve and/or be controlled Outcome: Progressing

## 2024-07-07 NOTE — Progress Notes (Signed)
 PROGRESS NOTE  Charles Arroyo FMW:[id number] DOB: [birth year] DOA: 07/06/2024 PCP: Lenon Layman ORN, MD  Brief History:  23 year old male with a history of TBI, spastic hemiparesis, and cognitive impairment presented with agitation, decreased oral intake, and generalized weakness.  The patient has also been complaining of left flank pain.  He was seen in the emergency department on 07/05/2024 and was stable for discharge home at that point.  He was discharged home with cephalexin  for UTI.  Blood cultures were obtained at that time prior to his discharge home. His family was called today and notified of the positive blood cultures and was asked to return. He has been having continued left flank pain, agitation, decreased appetite both oral food and liquid.   In the ED, the patient was afebrile and hemodynamically stable with oxygen saturation 93%-97% on room air.  WBC 10.0, hemoglobin 12.6, platelet 225.  Sodium 130, potassium 4.0, bicarbonate 22, serum creatinine 1.13.  LFTs unremarkable.  Lactic acid 0.9.  The patient was started on ceftriaxone .  07/05/2024 blood cultures gram-negative rods with multiplex PCR showing E. coli without drug resistance.     Assessment/Plan: Pyelonephritis - 07/06/2024 CT AP--mild left hydroureter with mild left perinephric stranding; nonobstructive left renal stone.  Enlarged left periaortic nodes - Continue ceftriaxone   E. coli bacteremia - Continue ceftriaxone   AKI - Baseline creatinine 0.6-0.9 - Presented with serum creatinine 1.50 - Secondary to volume depletion - Continue IV fluids>> improving  Spastic hemiparesis - Continue Zanaflex, dantrolene , Valium  Cognitive impairment - Continue Depakote  and Seroquel          Family Communication:   mother at bedside 10/18  Consultants:  none  Code Status:  FULL   DVT Prophylaxis:  [place] Lovenox   Procedures: As Listed in Progress Note Above  Antibiotics: Ceftriaxone   10/17>>     Subjective: He complains of left side abdominal pain.  Denies any chest pain, shortness breath.  Remainder review of systems unreliable.  Objective: Vitals:   07/07/24 0016 07/07/24 0129 07/07/24 0431 07/07/24 0805  BP:   114/73 127/69  Pulse: (!) 110 (!) 108 90 (!) 108  Resp:   18 18  Temp: 99.8 F (37.7 C) 98.8 F (37.1 C) 97.8 F (36.6 C) 99.5 F (37.5 C)  TempSrc: Axillary Axillary Axillary Axillary  SpO2: 94% 92% 97% 93%  Weight:      Height:        Intake/Output Summary (Last 24 hours) at 07/07/2024 0857 Last data filed at 07/07/2024 0431 Gross per 24 hour  Intake 1640 ml  Output 550 ml  Net 1090 ml   Weight change:  Exam:  General:  Pt is alert, follows commands appropriately, not in acute distress HEENT: No icterus, No thrush, No neck mass, [place]/AT Cardiovascular: RRR, S1/S2, no rubs, no gallops Respiratory: left base crackles.  No wheeze Abdomen: Soft/+BS, left side tender, non distended, no guarding Extremities: Nonpitting edema, No lymphangitis, No petechiae, No rashes, no synovitis   Data Reviewed: I have personally reviewed following labs and imaging studies Basic Metabolic Panel: Recent Labs  Lab 07/05/24 1707 07/06/24 1556 07/07/24 0415  NA 132* 130* 131*  K 3.7 4.0 3.6  CL 97* 97* 100  CO2 22 22 22   GLUCOSE 103* 101* 99  BUN 24* 20 14  CREATININE 1.50* 1.13 0.72  CALCIUM 9.5 9.2 8.3*   Liver Function Tests: Recent Labs  Lab 07/05/24 1707 07/06/24 1556  AST 42* 26  ALT 52* 36  ALKPHOS 100 145*  BILITOT 1.0 0.6  PROT 7.4 6.7  ALBUMIN 4.3 3.6   No results for input(s): LIPASE, AMYLASE in the last 168 hours. No results for input(s): AMMONIA in the last 168 hours. Coagulation Profile: No results for input(s): INR, PROTIME in the last 168 hours. CBC: Recent Labs  Lab 07/05/24 1707 07/06/24 1556 07/07/24 0415  WBC 25.6* 18.0* 14.0*  NEUTROABS 22.7* 15.5*  --   HGB 13.7 12.6* 10.7*  HCT 40.5 36.3* 30.8*   MCV 93.3 92.1 91.7  PLT 280 225 191   Cardiac Enzymes: No results for input(s): CKTOTAL, CKMB, CKMBINDEX, TROPONINI in the last 168 hours. BNP: Invalid input(s): POCBNP CBG: No results for input(s): GLUCAP in the last 168 hours. HbA1C: No results for input(s): HGBA1C in the last 72 hours. Urine analysis:    Component Value Date/Time   COLORURINE AMBER (A) 07/05/2024 2132   APPEARANCEUR HAZY (A) 07/05/2024 2132   APPEARANCEUR Turbid (A) 11/25/2020 0800   LABSPEC 1.031 (H) 07/05/2024 2132   PHURINE 5.0 07/05/2024 2132   GLUCOSEU NEGATIVE 07/05/2024 2132   HGBUR SMALL (A) 07/05/2024 2132   BILIRUBINUR NEGATIVE 07/05/2024 2132   BILIRUBINUR Negative 11/25/2020 0800   KETONESUR 20 (A) 07/05/2024 2132   PROTEINUR 100 (A) 07/05/2024 2132   UROBILINOGEN 0.2 04/07/2014 1909   NITRITE POSITIVE (A) 07/05/2024 2132   LEUKOCYTESUR MODERATE (A) 07/05/2024 2132   Sepsis Labs: @LABRCNTIP (procalcitonin:4,lacticidven:4) ) Recent Results (from the past 240 hours)  Resp panel by RT-PCR (RSV, Flu A&B, Covid) Anterior Nasal Swab     Status: None   Collection Time: 07/05/24  7:20 PM   Specimen: Anterior Nasal Swab  Result Value Ref Range Status   SARS Coronavirus 2 by RT PCR NEGATIVE NEGATIVE Final    Comment: (NOTE) SARS-CoV-2 target nucleic acids are NOT DETECTED.  The SARS-CoV-2 RNA is generally detectable in upper respiratory specimens during the acute phase of infection. The lowest concentration of SARS-CoV-2 viral copies this assay can detect is 138 copies/mL. A negative result does not preclude SARS-Cov-2 infection and should not be used as the sole basis for treatment or other patient management decisions. A negative result may occur with  improper specimen collection/handling, submission of specimen other than nasopharyngeal swab, presence of viral mutation(s) within the areas targeted by this assay, and inadequate number of viral copies(<138 copies/mL). A negative  result must be combined with clinical observations, patient history, and epidemiological information. The expected result is Negative.  Fact Sheet for Patients:  BloggerCourse.com  Fact Sheet for Healthcare Providers:  SeriousBroker.it  This test is no t yet approved or cleared by the [place]  FDA and  has been authorized for detection and/or diagnosis of SARS-CoV-2 by FDA under an Emergency Use Authorization (EUA). This EUA will remain  in effect (meaning this test can be used) for the duration of the COVID-19 declaration under Section 564(b)(1) of the Act, 21 U.S.C.section 360bbb-3(b)(1), unless the authorization is terminated  or revoked sooner.       Influenza A by PCR NEGATIVE NEGATIVE Final   Influenza B by PCR NEGATIVE NEGATIVE Final    Comment: (NOTE) The Xpert Xpress SARS-CoV-2/FLU/RSV plus assay is intended as an aid in the diagnosis of influenza from Nasopharyngeal swab specimens and should not be used as a sole basis for treatment. Nasal washings and aspirates are unacceptable for Xpert Xpress SARS-CoV-2/FLU/RSV testing.  Fact Sheet for Patients: BloggerCourse.com  Fact Sheet for Healthcare Providers: SeriousBroker.it  This test is  not yet approved or cleared by the [place]  FDA and has been authorized for detection and/or diagnosis of SARS-CoV-2 by FDA under an Emergency Use Authorization (EUA). This EUA will remain in effect (meaning this test can be used) for the duration of the COVID-19 declaration under Section 564(b)(1) of the Act, 21 U.S.C. section 360bbb-3(b)(1), unless the authorization is terminated or revoked.     Resp Syncytial Virus by PCR NEGATIVE NEGATIVE Final    Comment: (NOTE) Fact Sheet for Patients: BloggerCourse.com  Fact Sheet for Healthcare Providers: SeriousBroker.it  This  test is not yet approved or cleared by the [place]  FDA and has been authorized for detection and/or diagnosis of SARS-CoV-2 by FDA under an Emergency Use Authorization (EUA). This EUA will remain in effect (meaning this test can be used) for the duration of the COVID-19 declaration under Section 564(b)(1) of the Act, 21 U.S.C. section 360bbb-3(b)(1), unless the authorization is terminated or revoked.  Performed at [facility], [address], [place], [place] [address]   Culture, blood (routine x 2)     Status: None (Preliminary result)   Collection Time: 07/05/24  7:22 PM   Specimen: BLOOD RIGHT ARM  Result Value Ref Range Status   Specimen Description   Final    BLOOD RIGHT ARM Performed at [facility], [address], [place], [place] [address]    Special Requests AEROBIC BOTTLE ONLY Blood Culture adequate volume  Final   Culture  Setup Time GRAM NEGATIVE RODS  Final   Culture   Final    AEROBIC BOTTLE ONLY GRAM NEGATIVE RODS Gram Stain Report Called to,Read Back By and Verified With: DEANNA F. ON 06/20/2024 @2 :00PM ON 07/06/2024 BY IVAR ECHEVARIA  Performed at [facility], [address] [place], [place] [address]    Report Status PENDING  Incomplete  Culture, blood (routine x 2)     Status: None (Preliminary result)   Collection Time: 07/05/24  7:22 PM   Specimen: BLOOD RIGHT ARM  Result Value Ref Range Status   Specimen Description   Final    BLOOD RIGHT ARM Performed at [facility], [address] [place], [place] [address]    Special Requests   Final    BOTTLES DRAWN AEROBIC AND ANAEROBIC Blood Culture adequate volume Performed at [facility], [address] [place], [place] [address]    Culture  Setup Time   Final    GRAM NEGATIVE RODS ANAEROBIC BOTTLE ONLY Gram Stain Report Called to,Read Back By and Verified With: DEANNA F @ 1037 ON [id number] BY HENDERSON L CRITICAL RESULT CALLED TO, READ BACK BY AND VERIFIED WITH: RN D.SAWELER AT 1443 ON 07/06/2024  BY T.SAAD. Performed at [facility], [address] [place], [place] [address]    Culture GRAM NEGATIVE RODS  Final   Report Status PENDING  Incomplete  Blood Culture ID Panel (Reflexed)     Status: Abnormal   Collection Time: 07/05/24  7:22 PM  Result Value Ref Range Status   Enterococcus faecalis NOT DETECTED NOT DETECTED Final   Enterococcus Faecium NOT DETECTED NOT DETECTED Final   Listeria monocytogenes NOT DETECTED NOT DETECTED Final   Staphylococcus species NOT DETECTED NOT DETECTED Final   Staphylococcus aureus (BCID) NOT DETECTED NOT DETECTED Final   Staphylococcus epidermidis NOT DETECTED NOT DETECTED Final   Staphylococcus lugdunensis NOT DETECTED NOT DETECTED Final   Streptococcus species NOT DETECTED NOT DETECTED Final   Streptococcus agalactiae NOT DETECTED NOT DETECTED Final  Streptococcus pneumoniae NOT DETECTED NOT DETECTED Final   Streptococcus pyogenes NOT DETECTED NOT DETECTED Final   A.calcoaceticus-baumannii NOT DETECTED NOT DETECTED Final   Bacteroides fragilis NOT DETECTED NOT DETECTED Final   Enterobacterales DETECTED (A) NOT DETECTED Final    Comment: Enterobacterales represent a large order of gram negative bacteria, not a single organism. CRITICAL RESULT CALLED TO, READ BACK BY AND VERIFIED WITH: RN D.SAWELER AT 1443 ON 07/06/2024 BY T.SAAD.    Enterobacter cloacae complex NOT DETECTED NOT DETECTED Final   Escherichia coli DETECTED (A) NOT DETECTED Final    Comment: CRITICAL RESULT CALLED TO, READ BACK BY AND VERIFIED WITH: RN D.SAWELER AT 1443 ON 07/06/2024 BY T.SAAD.    Klebsiella aerogenes NOT DETECTED NOT DETECTED Final   Klebsiella oxytoca NOT DETECTED NOT DETECTED Final   Klebsiella pneumoniae NOT DETECTED NOT DETECTED Final   Proteus species NOT DETECTED NOT DETECTED Final   Salmonella species NOT DETECTED NOT DETECTED Final   Serratia marcescens NOT DETECTED NOT DETECTED Final   Haemophilus influenzae NOT DETECTED NOT DETECTED Final    Neisseria meningitidis NOT DETECTED NOT DETECTED Final   Pseudomonas aeruginosa NOT DETECTED NOT DETECTED Final   Stenotrophomonas maltophilia NOT DETECTED NOT DETECTED Final   Candida albicans NOT DETECTED NOT DETECTED Final   Candida auris NOT DETECTED NOT DETECTED Final   Candida glabrata NOT DETECTED NOT DETECTED Final   Candida krusei NOT DETECTED NOT DETECTED Final   Candida parapsilosis NOT DETECTED NOT DETECTED Final   Candida tropicalis NOT DETECTED NOT DETECTED Final   Cryptococcus neoformans/gattii NOT DETECTED NOT DETECTED Final   CTX-M ESBL NOT DETECTED NOT DETECTED Final   Carbapenem resistance IMP NOT DETECTED NOT DETECTED Final   Carbapenem resistance KPC NOT DETECTED NOT DETECTED Final   Carbapenem resistance NDM NOT DETECTED NOT DETECTED Final   Carbapenem resist OXA 48 LIKE NOT DETECTED NOT DETECTED Final   Carbapenem resistance VIM NOT DETECTED NOT DETECTED Final    Comment: Performed at [facility], [address] [place], [place] [address]  Culture, blood (routine x 2)     Status: None (Preliminary result)   Collection Time: 07/06/24  3:56 PM   Specimen: BLOOD RIGHT HAND  Result Value Ref Range Status   Specimen Description BLOOD RIGHT HAND  Final   Special Requests   Final    BOTTLES DRAWN AEROBIC AND ANAEROBIC Blood Culture adequate volume   Culture   Final    NO GROWTH < 24 HOURS Performed at [facility], [address] [place], [place] [address]    Report Status PENDING  Incomplete  Culture, blood (routine x 2)     Status: None (Preliminary result)   Collection Time: 07/06/24  3:56 PM   Specimen: BLOOD RIGHT ARM  Result Value Ref Range Status   Specimen Description BLOOD RIGHT ARM  Final   Special Requests   Final    BOTTLES DRAWN AEROBIC AND ANAEROBIC Blood Culture adequate volume   Culture   Final    NO GROWTH < 24 HOURS Performed at [facility], [address], [place], [place] [address]    Report Status PENDING  Incomplete      Scheduled Meds:  dantrolene   100 mg Oral TID   diazepam  2 mg Oral q AM   divalproex   125 mg Oral q morning   divalproex   250 mg Oral QHS   DULoxetine  60 mg Oral Daily   enoxaparin (LOVENOX) injection  40 mg Subcutaneous Q24H  polyethylene glycol  17 g Oral Daily   QUEtiapine   100 mg Oral QHS   tiZANidine  2 mg Oral TID   Continuous Infusions:  0.9 % NaCl with KCl 20 mEq / L     cefTRIAXone  (ROCEPHIN )  IV      Procedures/Studies: CT ABDOMEN PELVIS WO CONTRAST Result Date: 07/06/2024 EXAM: CT ABDOMEN AND PELVIS WITHOUT CONTRAST 07/06/2024 05:26:00 PM TECHNIQUE: CT of the abdomen and pelvis was performed without the administration of intravenous contrast. Multiplanar reformatted images are provided for review. Automated exposure control, iterative reconstruction, and/or weight-based adjustment of the mA/kV was utilized to reduce the radiation dose to as low as reasonably achievable. COMPARISON: Comparison with 03/29/2023. CLINICAL HISTORY: Abdominal pain, acute, nonlocalized. Pt to the ED from home with complaints of abnormal lab results after being seen in the ED yesterday. FINDINGS: LOWER CHEST: Atelectasis or infiltrates in the bilateral lower lobes. Trace left pleural effusion. LIVER: The liver is unremarkable. GALLBLADDER AND BILE DUCTS: Gallbladder is unremarkable. No biliary ductal dilatation. SPLEEN: Status post splenectomy. Splenules in the left upper quadrant. PANCREAS: No acute abnormality. ADRENAL GLANDS: No acute abnormality. KIDNEYS, URETERS AND BLADDER: Nonobstructing 1.3 cm stone in the left kidney. Mild asymmetric left perinephric stranding. Mild left hydroureter. Evaluation for possible pyelonephritis is limited on noncontrast exam. Correlation with urinalysis is recommended. Urinary bladder is unremarkable. GI AND BOWEL: Stomach demonstrates no acute abnormality. There is no bowel obstruction. Normal appendix. PERITONEUM AND RETROPERITONEUM: No ascites. No free air.  VASCULATURE: Aorta is normal in caliber. LYMPH NODES: New left retroperitoneum lymphadenopathy compared to 03/29/2023. For example, a left periaortic node on series 8 image 55 measures 1.4 cm, previously 0.9 cm. REPRODUCTIVE ORGANS: No acute abnormality. BONES AND SOFT TISSUES: No acute osseous abnormality. No focal soft tissue abnormality. IMPRESSION: 1. Mild left hydroureter and mild asymmetric left perinephric stranding. Evaluation for pyelonephritis is limited without IV contrast. Correlation with urinalysis is recommended. 2. Nonobstructing 1.3 cm stone in the left kidney. 3. Enlarged left periaortic lymph nodes, favored reactive given history of left pyelonephritis. Continue attention on follow up. 4. Bilateral lower lobe atelectasis or infiltrates. Electronically signed by: Norman Gatlin MD 07/06/2024 06:10 PM EDT RP Workstation: [id number]   DG Chest Port 1 View Result Date: 07/05/2024 EXAM: 1 VIEW XRAY OF THE CHEST 07/05/2024 07:09:26 PM COMPARISON: Abdominal series x-ray 792/000/24. CLINICAL HISTORY: fever. Fever, low blood pressure. FINDINGS: LUNGS AND PLEURA: No focal pulmonary opacity. No pulmonary edema. No pleural effusion. No pneumothorax. HEART AND MEDIASTINUM: The heart is mildly enlarged, unchanged. BONES AND SOFT TISSUES: No acute osseous abnormality. IMPRESSION: 1. No acute findings. 2. Stable mild cardiomegaly Electronically signed by: Greig Pique MD 07/05/2024 07:26 PM EDT RP Workstation: [id number]    Alm Schneider, DO  [facility]  If 7PM-7AM, please contact night-coverage [email or web address] Password [email or web address] 07/07/2024, 8:57 AM   LOS: 1 day

## 2024-07-07 NOTE — Plan of Care (Signed)
  Problem: Education: Goal: Knowledge of General Education information will improve Description: Including pain rating scale, medication(s)/side effects and non-pharmacologic comfort measures Outcome: Progressing   Problem: Health Behavior/Discharge Planning: Goal: Ability to manage health-related needs will improve Outcome: Progressing   Problem: Clinical Measurements: Goal: Ability to maintain clinical measurements within normal limits will improve Outcome: Progressing Goal: Will remain free from infection Outcome: Progressing Goal: Diagnostic test results will improve Outcome: Progressing Goal: Cardiovascular complication will be avoided Outcome: Progressing   Problem: Nutrition: Goal: Adequate nutrition will be maintained Outcome: Progressing   Problem: Coping: Goal: Level of anxiety will decrease Outcome: Progressing   Problem: Elimination: Goal: Will not experience complications related to bowel motility Outcome: Progressing Goal: Will not experience complications related to urinary retention Outcome: Progressing   Problem: Pain Managment: Goal: General experience of comfort will improve and/or be controlled Outcome: Progressing   Problem: Safety: Goal: Ability to remain free from injury will improve Outcome: Progressing   Problem: Skin Integrity: Goal: Risk for impaired skin integrity will decrease Outcome: Progressing

## 2024-07-07 NOTE — Hospital Course (Addendum)
 23 year old male with a history of TBI, spastic hemiparesis, and cognitive impairment presented with agitation, decreased oral intake, and generalized weakness.  The patient has also been complaining of left flank pain.  He was seen in the emergency department on 07/05/2024 and was stable for discharge home at that point.  He was discharged home with cephalexin  for UTI.  Blood cultures were obtained at that time prior to his discharge home. His family was called today and notified of the positive blood cultures and was asked to return. He has been having continued left flank pain, agitation, decreased appetite both oral food and liquid.   In the ED, the patient was afebrile and hemodynamically stable with oxygen saturation 93%-97% on room air.  WBC 10.0, hemoglobin 12.6, platelet 225.  Sodium 130, potassium 4.0, bicarbonate 22, serum creatinine 1.13.  LFTs unremarkable.  Lactic acid 0.9.  The patient was started on ceftriaxone .  07/05/2024 blood cultures gram-negative rods with multiplex PCR showing E. coli without drug resistance.

## 2024-07-07 NOTE — TOC CM/SW Note (Signed)
 Transition of Care [facility]) - Inpatient Brief Assessment   Patient Details  Name: Charles Arroyo MRN: [id number] Date of Birth: [birth year]  [facility]) CM/SW Contact:    Lucie Lunger, LCSWA Phone Number: 07/07/2024, 10:07 AM   Clinical Narrative: Transition of Care Department [facility]) has reviewed patient and no TOC needs have been identified at this time. We will continue to monitor patient advancement through interdiciplinary progression rounds. If new patient transition needs arise, please place a TOC consult.  Transition of Care Asessment: Insurance and Status: Insurance coverage has been reviewed Patient has primary care physician: Yes Home environment has been reviewed: From home Prior level of function:: Family can assist Prior/Current Home Services: No current home services Social Drivers of Health Review: SDOH reviewed no interventions necessary Readmission risk has been reviewed: Yes Transition of care needs: no transition of care needs at this time

## 2024-07-08 DIAGNOSIS — G8114 Spastic hemiplegia affecting left nondominant side: Secondary | ICD-10-CM | POA: Diagnosis not present

## 2024-07-08 DIAGNOSIS — R7881 Bacteremia: Secondary | ICD-10-CM | POA: Diagnosis not present

## 2024-07-08 DIAGNOSIS — N10 Acute tubulo-interstitial nephritis: Secondary | ICD-10-CM | POA: Diagnosis not present

## 2024-07-08 LAB — BASIC METABOLIC PANEL WITH GFR
Anion gap: 8 (ref 5–15)
BUN: 7 mg/dL (ref 6–20)
CO2: 25 mmol/L (ref 22–32)
Calcium: 8.1 mg/dL — ABNORMAL LOW (ref 8.9–10.3)
Chloride: 99 mmol/L (ref 98–111)
Creatinine, Ser: 0.5 mg/dL — ABNORMAL LOW (ref 0.61–1.24)
GFR, Estimated: >60 mL/min (ref >60)
Glucose, Bld: 117 mg/dL — ABNORMAL HIGH (ref 70–99)
Potassium: 3.6 mmol/L (ref 3.5–5.1)
Sodium: 132 mmol/L — ABNORMAL LOW (ref 135–145)

## 2024-07-08 LAB — URINE CULTURE: Culture: >=100000 — AB

## 2024-07-08 LAB — CBC
HCT: 31 % — ABNORMAL LOW (ref 39.0–52.0)
Hemoglobin: 10.7 g/dL — ABNORMAL LOW (ref 13.0–17.0)
MCH: 31.3 pg (ref 26.0–34.0)
MCHC: 34.5 g/dL (ref 30.0–36.0)
MCV: 90.6 fL (ref 80.0–100.0)
Platelets: 212 K/uL (ref 150–400)
RBC: 3.42 MIL/uL — ABNORMAL LOW (ref 4.22–5.81)
RDW: 13.7 % (ref 11.5–15.5)
WBC: 8.4 K/uL (ref 4.0–10.5)
nRBC: 0 % (ref 0.0–0.2)

## 2024-07-08 LAB — MAGNESIUM: Magnesium: 2 mg/dL (ref 1.7–2.4)

## 2024-07-08 MED ORDER — CEFADROXIL 500 MG PO CAPS: 1000.0000 mg | ORAL_CAPSULE | Freq: Two times a day (BID) | ORAL | 0 refills | Status: DC

## 2024-07-08 MED ORDER — CEFADROXIL 500 MG PO CAPS: 1000.0000 mg | ORAL_CAPSULE | Freq: Two times a day (BID) | ORAL | Status: DC

## 2024-07-08 NOTE — Discharge Summary (Signed)
 Physician Discharge Summary   Patient: Charles Arroyo MRN: [id number] DOB: 2001-03-31  Admit date:     07/06/2024  Discharge date: 07/08/24  Discharge Physician: Alm Cecily Lawhorne   PCP: Lenon Layman ORN, MD   Recommendations at discharge:   Please follow up with primary care provider within 1-2 weeks  Please repeat BMP and CBC in one week    Hospital Course: 23 year old male with a history of TBI, spastic hemiparesis, and cognitive impairment presented with agitation, decreased oral intake, and generalized weakness.  The patient has also been complaining of left flank pain.  He was seen in the emergency department on 07/05/2024 and was stable for discharge home at that point.  He was discharged home with cephalexin  for UTI.  Blood cultures were obtained at that time prior to his discharge home. His family was called today and notified of the positive blood cultures and was asked to return. He has been having continued left flank pain, agitation, decreased appetite both oral food and liquid.   In the ED, the patient was afebrile and hemodynamically stable with oxygen saturation 93%-97% on room air.  WBC 10.0, hemoglobin 12.6, platelet 225.  Sodium 130, potassium 4.0, bicarbonate 22, serum creatinine 1.13.  LFTs unremarkable.  Lactic acid 0.9.  The patient was started on ceftriaxone .  07/05/2024 blood cultures gram-negative rods with multiplex PCR showing E. coli without drug resistance.    Assessment and Plan: Pyelonephritis  - 07/06/2024 [facility]--mild left hydroureter with mild left perinephric stranding; nonobstructive left renal stone.  Enlarged left periaortic nodes - Continued ceftriaxone  pending culture data - transition to po cefadroxil for d/c x 7 more days -WBC improved 18.0>>8.4   E. coli bacteremia - Continued ceftriaxone  - transition to po cefadroxil for dc x 7 more days   AKI - Baseline creatinine 0.6-0.9 - Presented with serum creatinine 1.50 - Secondary to volume  depletion - Continue IV fluids>> improved   Spastic hemiparesis - Continue Zanaflex, dantrolene , Valium   Cognitive impairment - Continue Depakote  and Seroquel       Consultants: none Procedures performed: none  Disposition: Home Diet recommendation:  Regular diet DISCHARGE MEDICATION: Allergies as of 07/08/2024   No Known Allergies      Medication List     STOP taking these medications    cephALEXin  500 MG capsule Commonly known as: KEFLEX        TAKE these medications    acetaminophen  500 MG tablet Commonly known as: TYLENOL  Take 1,000 mg by mouth every 6 (six) hours as needed for mild pain (pain score 1-3), moderate pain (pain score 4-6) or fever (*Must be crushed).   aspirin EC 81 MG tablet Take 81 mg by mouth daily. Swallow whole.   cefadroxil 500 MG capsule Commonly known as: DURICEF Take 2 capsules (1,000 mg total) by mouth 2 (two) times daily. Start taking on: July 09, 2024   dantrolene  25 MG capsule Commonly known as: DANTRIUM  Take 75 mg by mouth 3 (three) times daily.   diazepam 2 MG tablet Commonly known as: VALIUM Take 2 mg by mouth every evening. Between 1400 and 1500   divalproex  250 MG DR tablet Commonly known as: DEPAKOTE  Take 250 mg by mouth at bedtime.   divalproex  125 MG DR tablet Commonly known as: DEPAKOTE  Take 125 mg by mouth in the morning.   DULoxetine 60 MG capsule Commonly known as: CYMBALTA Take 60 mg by mouth daily.   ibuprofen  200 MG tablet Commonly known as: ADVIL  Take 400 mg by  mouth every 6 (six) hours as needed for fever.   multivitamin tablet Take 1 tablet by mouth daily. Gummies   polyethylene glycol 17 g packet Commonly known as: MIRALAX  / GLYCOLAX  Take 17 g by mouth daily.   QUEtiapine  100 MG tablet Commonly known as: SEROQUEL  Take 100 mg by mouth at bedtime.   tiZANidine 2 MG tablet Commonly known as: ZANAFLEX Take 2 mg by mouth 3 (three) times daily.        Discharge Exam: Filed Weights    07/06/24 1509 07/06/24 2024  Weight: 83.9 kg 93.5 kg   HEENT:  [place]/AT, No thrush, no icterus CV:  RRR, no rub, no S3, no S4 Lung:  CTA, no wheeze, no rhonchi Abd:  soft/+BS, NT Ext:  No edema, no lymphangitis, no synovitis, no rash   Condition at discharge: stable  The results of significant diagnostics from this hospitalization (including imaging, microbiology, ancillary and laboratory) are listed below for reference.   Imaging Studies: DG CHEST PORT 1 VIEW Result Date: 07/07/2024 CLINICAL DATA:  Dyspnea. EXAM: PORTABLE CHEST 1 VIEW COMPARISON:  07/05/2024 FINDINGS: Stable top-normal heart size. Suggestion bibasilar atelectasis and mild elevation of the right hemidiaphragm. Potential mild bronchial thickening without focal airspace consolidation. No pulmonary edema, pleural fluid or pneumothorax identified. The visualized skeletal structures are unremarkable. IMPRESSION: Suggestion of bibasilar atelectasis and mild elevation of the right hemidiaphragm. Potential mild bronchial thickening without focal airspace consolidation. Electronically Signed   By: Marcey Moan M.D.   On: 07/07/2024 09:55   CT ABDOMEN PELVIS WO CONTRAST Result Date: 07/06/2024 EXAM: CT ABDOMEN AND PELVIS WITHOUT CONTRAST 07/06/2024 05:26:00 PM TECHNIQUE: CT of the abdomen and pelvis was performed without the administration of intravenous contrast. Multiplanar reformatted images are provided for review. Automated exposure control, iterative reconstruction, and/or weight-based adjustment of the mA/kV was utilized to reduce the radiation dose to as low as reasonably achievable. COMPARISON: Comparison with 03/29/2023. CLINICAL HISTORY: Abdominal pain, acute, nonlocalized. Pt to the ED from home with complaints of abnormal lab results after being seen in the ED yesterday. FINDINGS: LOWER CHEST: Atelectasis or infiltrates in the bilateral lower lobes. Trace left pleural effusion. LIVER: The liver is unremarkable. GALLBLADDER  AND BILE DUCTS: Gallbladder is unremarkable. No biliary ductal dilatation. SPLEEN: Status post splenectomy. Splenules in the left upper quadrant. PANCREAS: No acute abnormality. ADRENAL GLANDS: No acute abnormality. KIDNEYS, URETERS AND BLADDER: Nonobstructing 1.3 cm stone in the left kidney. Mild asymmetric left perinephric stranding. Mild left hydroureter. Evaluation for possible pyelonephritis is limited on noncontrast exam. Correlation with urinalysis is recommended. Urinary bladder is unremarkable. GI AND BOWEL: Stomach demonstrates no acute abnormality. There is no bowel obstruction. Normal appendix. PERITONEUM AND RETROPERITONEUM: No ascites. No free air. VASCULATURE: Aorta is normal in caliber. LYMPH NODES: New left retroperitoneum lymphadenopathy compared to 03/29/2023. For example, a left periaortic node on series 8 image 55 measures 1.4 cm, previously 0.9 cm. REPRODUCTIVE ORGANS: No acute abnormality. BONES AND SOFT TISSUES: No acute osseous abnormality. No focal soft tissue abnormality. IMPRESSION: 1. Mild left hydroureter and mild asymmetric left perinephric stranding. Evaluation for pyelonephritis is limited without IV contrast. Correlation with urinalysis is recommended. 2. Nonobstructing 1.3 cm stone in the left kidney. 3. Enlarged left periaortic lymph nodes, favored reactive given history of left pyelonephritis. Continue attention on follow up. 4. Bilateral lower lobe atelectasis or infiltrates. Electronically signed by: Norman Gatlin MD 07/06/2024 06:10 PM EDT RP Workstation: [id number]   DG Chest Port 1 View Result Date: 07/05/2024 EXAM: 1 VIEW  XRAY OF THE CHEST 07/05/2024 07:09:26 PM COMPARISON: Abdominal series x-ray 792/000/24. CLINICAL HISTORY: fever. Fever, low blood pressure. FINDINGS: LUNGS AND PLEURA: No focal pulmonary opacity. No pulmonary edema. No pleural effusion. No pneumothorax. HEART AND MEDIASTINUM: The heart is mildly enlarged, unchanged. BONES AND SOFT TISSUES: No acute  osseous abnormality. IMPRESSION: 1. No acute findings. 2. Stable mild cardiomegaly Electronically signed by: Greig Pique MD 07/05/2024 07:26 PM EDT RP Workstation: [id number]    Microbiology: Results for orders placed or performed during the hospital encounter of 07/06/24  Culture, blood (routine x 2)     Status: None (Preliminary result)   Collection Time: 07/06/24  3:56 PM   Specimen: BLOOD RIGHT HAND  Result Value Ref Range Status   Specimen Description BLOOD RIGHT HAND  Final   Special Requests   Final    BOTTLES DRAWN AEROBIC AND ANAEROBIC Blood Culture adequate volume   Culture   Final    NO GROWTH 2 DAYS Performed at [facility], [address] [place], [place] [address]    Report Status PENDING  Incomplete  Culture, blood (routine x 2)     Status: None (Preliminary result)   Collection Time: 07/06/24  3:56 PM   Specimen: BLOOD RIGHT ARM  Result Value Ref Range Status   Specimen Description BLOOD RIGHT ARM  Final   Special Requests   Final    BOTTLES DRAWN AEROBIC AND ANAEROBIC Blood Culture adequate volume   Culture   Final    NO GROWTH 2 DAYS Performed at [facility], [address], [place], [place] [address]    Report Status PENDING  Incomplete    Labs: CBC: Recent Labs  Lab 07/05/24 1707 07/06/24 1556 07/07/24 0415 07/08/24 0333  WBC 25.6* 18.0* 14.0* 8.4  NEUTROABS 22.7* 15.5*  --   --   HGB 13.7 12.6* 10.7* 10.7*  HCT 40.5 36.3* 30.8* 31.0*  MCV 93.3 92.1 91.7 90.6  PLT 280 225 191 212   Basic Metabolic Panel: Recent Labs  Lab 07/05/24 1707 07/06/24 1556 07/07/24 0415 07/08/24 0333  NA 132* 130* 131* 132*  K 3.7 4.0 3.6 3.6  CL 97* 97* 100 99  CO2 22 22 22 25   GLUCOSE 103* 101* 99 117*  BUN 24* 20 14 7   CREATININE 1.50* 1.13 0.72 0.50*  CALCIUM 9.5 9.2 8.3* 8.1*  MG  --   --   --  2.0   Liver Function Tests: Recent Labs  Lab 07/05/24 1707 07/06/24 1556  AST 42* 26  ALT 52* 36  ALKPHOS 100 145*  BILITOT 1.0 0.6  PROT 7.4 6.7   ALBUMIN 4.3 3.6   CBG: No results for input(s): GLUCAP in the last 168 hours.  Discharge time spent: greater than 30 minutes.  Signed: Alm Schneider, MD [facility] 07/08/2024

## 2024-07-08 NOTE — Plan of Care (Signed)
   Problem: Education: Goal: Knowledge of General Education information will improve Description: Including pain rating scale, medication(s)/side effects and non-pharmacologic comfort measures Outcome: Progressing   Problem: Clinical Measurements: Goal: Will remain free from infection Outcome: Progressing   Problem: Clinical Measurements: Goal: Diagnostic test results will improve Outcome: Progressing

## 2024-07-09 ENCOUNTER — Telehealth (HOSPITAL_BASED_OUTPATIENT_CLINIC_OR_DEPARTMENT_OTHER): Payer: Self-pay

## 2024-07-09 LAB — CULTURE, BLOOD (ROUTINE X 2): Special Requests: ADEQUATE

## 2024-07-10 ENCOUNTER — Telehealth (HOSPITAL_BASED_OUTPATIENT_CLINIC_OR_DEPARTMENT_OTHER): Payer: Self-pay | Admitting: *Deleted

## 2024-07-10 LAB — CULTURE, BLOOD (ROUTINE X 2): Special Requests: ADEQUATE

## 2024-07-10 NOTE — Telephone Encounter (Signed)
 Post ED Visit - Positive Culture Follow-up  Culture report reviewed by antimicrobial stewardship pharmacist: [facility] []  [place], Pharm.D. []  Venetia Gully, Pharm.D., BCPS AQ-ID []  Garrel Crews, Pharm.D., BCPS []  Almarie Lunger, Pharm.D., BCPS []  [place], [address].D., BCPS, AAHIVP []  Rosaline Bihari, Pharm.D., BCPS, AAHIVP []  Vernell Meier, PharmD, BCPS []  Latanya Hint, PharmD, BCPS []  Donald Medley, PharmD, BCPS []  Rocky Bold, PharmD []  Dorothyann Alert, PharmD, BCPS [x]  Powell Blush, PharmD  [facility] []  Rosaline Edison, PharmD []  Romona Bliss, PharmD []  Dolphus Roller, PharmD []  Veva Seip, Rph []  Vernell Daunt) Leonce, PharmD []  Eva Allis, PharmD []  Rosaline Millet, PharmD []  Iantha Batch, PharmD []  Arvin Gauss, PharmD []  Wanda Hasting, PharmD []  Ronal Rav, PharmD []  Rocky Slade, PharmD []  Bard Jeans, PharmD   Positive blood culture Discharged from [facility] after 2 day admission. No follow up needed  Albino Alan Novak 07/10/2024, 10:25 AM

## 2024-07-11 ENCOUNTER — Telehealth (HOSPITAL_BASED_OUTPATIENT_CLINIC_OR_DEPARTMENT_OTHER): Payer: Self-pay | Admitting: *Deleted

## 2024-07-11 LAB — CULTURE, BLOOD (ROUTINE X 2)
Culture: NO GROWTH
Culture: NO GROWTH
Special Requests: ADEQUATE
Special Requests: ADEQUATE

## 2024-07-11 NOTE — Telephone Encounter (Signed)
 Post ED Visit - Positive Culture Follow-up  Culture report reviewed by antimicrobial stewardship pharmacist: [facility] [x]  Powell Blush, Pharm.D. []  Venetia Gully, [address].D., BCPS AQ-ID []  Garrel Crews, Pharm.D., BCPS []  Almarie Lunger, Pharm.D., BCPS []  [place], [address].D., BCPS, AAHIVP []  Rosaline Bihari, Pharm.D., BCPS, AAHIVP []  Vernell Meier, PharmD, BCPS []  Latanya Hint, PharmD, BCPS []  Donald Medley, PharmD, BCPS []  Rocky Bold, PharmD []  Dorothyann Alert, PharmD, BCPS []  Morene Babe, PharmD  [facility] []  Rosaline Edison, PharmD []  Romona Bliss, PharmD []  Dolphus Roller, PharmD []  Veva Seip, Rph []  Vernell Daunt) Leonce, PharmD []  Eva Allis, PharmD []  Rosaline Millet, PharmD []  Iantha Batch, PharmD []  Arvin Gauss, PharmD []  Wanda Hasting, PharmD []  Ronal Rav, PharmD []  Rocky Slade, PharmD []  Bard Jeans, PharmD   Positive blood culture Treated with cephalexin , organism sensitive to the same and no further patient follow-up is required at this time.  Pt discharged after 2-day stay at [facility]--no follow up needed.  Charles Arroyo 07/11/2024, 11:02 AM

## 2024-08-07 ENCOUNTER — Emergency Department (HOSPITAL_COMMUNITY)
Admission: EM | Admit: 2024-08-07 | Discharge: 2024-08-07 | Disposition: A | Discharge status: Home / Self Care | Attending: Emergency Medicine | Admitting: Emergency Medicine

## 2024-08-07 ENCOUNTER — Emergency Department (HOSPITAL_COMMUNITY)

## 2024-08-07 ENCOUNTER — Encounter (HOSPITAL_COMMUNITY): Payer: Self-pay

## 2024-08-07 ENCOUNTER — Other Ambulatory Visit: Payer: Self-pay

## 2024-08-07 DIAGNOSIS — R569 Unspecified convulsions: Secondary | ICD-10-CM | POA: Insufficient documentation

## 2024-08-07 DIAGNOSIS — Z7982 Long term (current) use of aspirin: Secondary | ICD-10-CM | POA: Diagnosis not present

## 2024-08-07 LAB — CBC WITH DIFFERENTIAL/PLATELET
Abs Immature Granulocytes: 0.04 K/uL (ref 0.00–0.07)
Basophils Absolute: 0 K/uL (ref 0.0–0.1)
Basophils Relative: 0 %
Eosinophils Absolute: 0 K/uL (ref 0.0–0.5)
Eosinophils Relative: 0 %
HCT: 43.1 % (ref 39.0–52.0)
Hemoglobin: 14.5 g/dL (ref 13.0–17.0)
Immature Granulocytes: 0 %
Lymphocytes Relative: 18 %
Lymphs Abs: 1.8 K/uL (ref 0.7–4.0)
MCH: 31.5 pg (ref 26.0–34.0)
MCHC: 33.6 g/dL (ref 30.0–36.0)
MCV: 93.7 fL (ref 80.0–100.0)
Monocytes Absolute: 0.8 K/uL (ref 0.1–1.0)
Monocytes Relative: 8 %
Neutro Abs: 7.5 K/uL (ref 1.7–7.7)
Neutrophils Relative %: 74 %
Platelets: 296 K/uL (ref 150–400)
RBC: 4.6 MIL/uL (ref 4.22–5.81)
RDW: 13.8 % (ref 11.5–15.5)
WBC: 10.2 K/uL (ref 4.0–10.5)
nRBC: 0 % (ref 0.0–0.2)

## 2024-08-07 LAB — URINE DRUG SCREEN
Amphetamines: NEGATIVE
Barbiturates: NEGATIVE
Benzodiazepines: POSITIVE — AB
Cocaine: NEGATIVE
Fentanyl: NEGATIVE
Methadone Scn, Ur: NEGATIVE
Opiates: NEGATIVE
Tetrahydrocannabinol: NEGATIVE

## 2024-08-07 LAB — URINALYSIS, ROUTINE W REFLEX MICROSCOPIC
Bacteria, UA: NONE SEEN
Bilirubin Urine: NEGATIVE
Glucose, UA: NEGATIVE mg/dL
Ketones, ur: NEGATIVE mg/dL
Leukocytes,Ua: NEGATIVE
Nitrite: NEGATIVE
Protein, ur: 30 mg/dL — AB
Specific Gravity, Urine: 1.02 (ref 1.005–1.030)
pH: 6 (ref 5.0–8.0)

## 2024-08-07 LAB — COMPREHENSIVE METABOLIC PANEL WITH GFR
ALT: 19 U/L (ref 0–44)
AST: 18 U/L (ref 15–41)
Albumin: 4.5 g/dL (ref 3.5–5.0)
Alkaline Phosphatase: 74 U/L (ref 38–126)
Anion gap: 14 (ref 5–15)
BUN: 10 mg/dL (ref 6–20)
CO2: 21 mmol/L — ABNORMAL LOW (ref 22–32)
Calcium: 9.3 mg/dL (ref 8.9–10.3)
Chloride: 100 mmol/L (ref 98–111)
Creatinine, Ser: 0.58 mg/dL — ABNORMAL LOW (ref 0.61–1.24)
GFR, Estimated: >60 mL/min (ref >60)
Glucose, Bld: 86 mg/dL (ref 70–99)
Potassium: 3.9 mmol/L (ref 3.5–5.1)
Sodium: 135 mmol/L (ref 135–145)
Total Bilirubin: 0.4 mg/dL (ref 0.0–1.2)
Total Protein: 7.6 g/dL (ref 6.5–8.1)

## 2024-08-07 LAB — CBG MONITORING, ED: Glucose-Capillary: 102 mg/dL — ABNORMAL HIGH (ref 70–99)

## 2024-08-07 LAB — MAGNESIUM: Magnesium: 2 mg/dL (ref 1.7–2.4)

## 2024-08-07 LAB — VALPROIC ACID LEVEL: Valproic Acid Lvl: 30 ug/mL — ABNORMAL LOW (ref 50–100)

## 2024-08-07 MED ORDER — METOCLOPRAMIDE HCL 5 MG/ML IJ SOLN
10.0000 mg | Freq: Once | INTRAMUSCULAR | Status: AC
  Administered 2024-08-07: 10 mg via INTRAVENOUS
  Filled 2024-08-07: qty 2

## 2024-08-07 MED ORDER — LACTATED RINGERS IV BOLUS
1000.0000 mL | Freq: Once | INTRAVENOUS | Status: AC
  Administered 2024-08-07: 1000 mL via INTRAVENOUS

## 2024-08-07 MED ORDER — DIVALPROEX SODIUM 125 MG PO CSDR: 350.0000 mg | DELAYED_RELEASE_CAPSULE | Freq: Once | ORAL | Status: DC

## 2024-08-07 MED ORDER — DIVALPROEX SODIUM 125 MG PO DR TAB: 350.0000 mg | DELAYED_RELEASE_TABLET | Freq: Three times a day (TID) | ORAL | 2 refills | Status: DC

## 2024-08-07 MED ORDER — KETOROLAC TROMETHAMINE 15 MG/ML IJ SOLN
15.0000 mg | Freq: Once | INTRAMUSCULAR | Status: AC
  Administered 2024-08-07: 15 mg via INTRAVENOUS
  Filled 2024-08-07: qty 1

## 2024-08-07 MED ORDER — DIPHENHYDRAMINE HCL 50 MG/ML IJ SOLN
12.5000 mg | Freq: Once | INTRAMUSCULAR | Status: AC
  Administered 2024-08-07: 12.5 mg via INTRAVENOUS
  Filled 2024-08-07: qty 1

## 2024-08-07 MED ORDER — VALPROATE SODIUM 100 MG/ML IV SOLN
1000.0000 mg | Freq: Once | INTRAVENOUS | Status: AC
  Administered 2024-08-07: 1000 mg via INTRAVENOUS
  Filled 2024-08-07: qty 10

## 2024-08-07 MED ORDER — LORAZEPAM 2 MG/ML IJ SOLN
1.0000 mg | Freq: Once | INTRAMUSCULAR | Status: AC
  Administered 2024-08-07: 1 mg via INTRAVENOUS
  Filled 2024-08-07: qty 1

## 2024-08-07 NOTE — ED Provider Notes (Signed)
 [place] [facility] AT [facility] Provider Note   CSN: [id number] Arrival date & time: 08/07/24  1011     Patient presents with: Seizures   Charles EPPERLY is a 23 y.o. male.   HPI Patient presents for seizures.  Medical history includes TBI, spastic hemiparesis of left side, PE.  He has seizures at time of his TBI in 2019.  He has not had any since up until today.  He is prescribed Depakote  for mood stabilization.  He was admitted a month ago for pyelonephritis and bacteremia.  He recently went up on his home dose of dantrolene .  Yesterday, he was in his normal state of health.  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  He has weakness in his left lower extremity but is able to walk unassisted.  This morning, he had seizure-like activity witnessed by his mother.  This occurred 3 times starting at 4:30 AM up until 9 AM.  Patient does not remember the episodes.  Mother describes it as a full body stiffening.  Patient felt warm to touch at home.  He was given Tylenol .  Currently, patient endorses feeling warm but denies any other current symptoms.  History per mother: She was woken up by dog barking at 4:30 AM.  She saw the patient with full body rigidity and shaking.  He was awake at the time.  Episode lasted for 1 minute.  Second episode this morning occurred at around 6:30 AM.  This was a similar episode.  This lasted for approximately 2 minutes.  A third episode at 9 AM also lasted for 2 minutes.  Patient informed her that he had been having these episodes but patient's mother is unaware of any of these prior to today.    Prior to Admission medications   Medication Sig Start Date End Date Taking? Authorizing Provider  acetaminophen  (TYLENOL ) 500 MG tablet Take 1,000 mg by mouth every 6 (six) hours as needed for mild pain (pain score 1-3), moderate pain (pain score 4-6) or fever (*Must be crushed).   Yes [provider]  aspirin EC  81 MG tablet Take 81 mg by mouth daily. Swallow whole.   Yes [provider]  dantrolene  (DANTRIUM ) 100 MG capsule Take 100 mg by mouth 3 (three) times daily. 07/26/24  Yes [provider]  diazepam (VALIUM) 2 MG tablet Take 2 mg by mouth every evening. Between 1400 and 1500 09/01/23  Yes [provider]  DULoxetine (CYMBALTA) 60 MG capsule Take 60 mg by mouth daily. 11/24/23  Yes [provider]  ibuprofen  (ADVIL ) 200 MG tablet Take 400 mg by mouth every 6 (six) hours as needed for fever.   Yes [provider]  Multiple Vitamin (MULTIVITAMIN) tablet Take 1 tablet by mouth daily. Gummies   Yes [provider]  polyethylene glycol (MIRALAX  / GLYCOLAX ) 17 g packet Take 17 g by mouth daily.   Yes [provider]  QUEtiapine  (SEROQUEL ) 100 MG tablet Take 100 mg by mouth at bedtime. 11/24/23  Yes [provider]  tiZANidine (ZANAFLEX) 2 MG tablet Take 2 mg by mouth 3 (three) times daily. 09/05/23  Yes [provider]  divalproex  (DEPAKOTE ) 125 MG DR tablet Take 3 tablets (375 mg total) by mouth 3 (three) times daily. 08/07/24 11/05/24  Charles Motto, MD  nystatin  cream (MYCOSTATIN ) Apply 1 Application topically 2 (two) times daily. 08/01/24 08/01/25  [provider]    Allergies: Pregabalin   Review of Systems  Neurological:  Positive for seizures.  All other systems reviewed and are negative.   Updated Vital Signs BP 123/80   Pulse (!) 108   Temp 99.2 F (37.3 C) (Oral)   Resp (!) 24   Ht 6' 2 (1.88 m)   Wt 68 kg   SpO2 93%   BMI 19.26 kg/m   Physical Exam Vitals and nursing note reviewed.  Constitutional:      General: He is not in acute distress.    Appearance: Normal appearance. He is well-developed. He is not toxic-appearing or diaphoretic.  HENT:     Head: Normocephalic and atraumatic.     Right Ear: External ear normal.     Left Ear: External ear normal.     Nose: Nose normal.      Mouth/Throat:     Mouth: Mucous membranes are moist.  Eyes:     Extraocular Movements: Extraocular movements intact.     Conjunctiva/sclera: Conjunctivae normal.  Cardiovascular:     Rate and Rhythm: Normal rate and regular rhythm.  Pulmonary:     Effort: Pulmonary effort is normal. No respiratory distress.  Abdominal:     General: There is no distension.     Palpations: Abdomen is soft.     Tenderness: There is no abdominal tenderness.  Musculoskeletal:        General: No swelling.     Cervical back: Normal range of motion and neck supple.     Comments: Chronic contracture and atrophy of right upper extremity  Skin:    General: Skin is warm and dry.     Coloration: Skin is not jaundiced or pale.  Neurological:     Mental Status: He is alert. Mental status is at baseline.  Psychiatric:        Mood and Affect: Mood normal.        Behavior: Behavior normal.     (all labs ordered are listed, but only abnormal results are displayed) Labs Reviewed  COMPREHENSIVE METABOLIC PANEL WITH GFR - Abnormal; Notable for the following components:      Result Value   CO2 21 (*)    Creatinine, Ser 0.58 (*)    All other components within normal limits  URINALYSIS, ROUTINE W REFLEX MICROSCOPIC - Abnormal; Notable for the following components:   Hgb urine dipstick SMALL (*)    Protein, ur 30 (*)    All other components within normal limits  URINE DRUG SCREEN - Abnormal; Notable for the following components:   Benzodiazepines POSITIVE (*)    All other components within normal limits  VALPROIC ACID LEVEL - Abnormal; Notable for the following components:   Valproic Acid Lvl 30 (*)    All other components within normal limits  CBG MONITORING, ED - Abnormal; Notable for the following components:   Glucose-Capillary 102 (*)    All other components within normal limits  CULTURE, BLOOD (ROUTINE X 2)  CULTURE, BLOOD (ROUTINE X 2)  URINE CULTURE  CBC WITH DIFFERENTIAL/PLATELET  MAGNESIUM     EKG: EKG Interpretation Date/Time:  Tuesday August 07 2024 10:26:15 EST Ventricular Rate:  106 PR Interval:  150 QRS Duration:  121 QT Interval:  339 QTC Calculation: 451 R Axis:   18  Text Interpretation: Sinus tachycardia IVCD, consider atypical RBBB Confirmed by Charles Arroyo [phone]) on 08/07/2024 11:13:17 AM  Radiology: [facility] Chest Portable 1 View Result Date: 08/07/2024 CLINICAL DATA:  Shaking. EXAM: PORTABLE CHEST 1 VIEW COMPARISON:  Chest radiograph dated 07/07/2024.  FINDINGS: No focal consolidation, pleural effusion or pneumothorax. The cardiac silhouette is within normal limits. No acute osseous pathology. IMPRESSION: No active disease. Electronically Signed   By: Vanetta Chou M.D.   On: 08/07/2024 12:55   CT HEAD WO CONTRAST Result Date: 08/07/2024 EXAM: CT HEAD WITHOUT CONTRAST 08/07/2024 12:29:48 PM TECHNIQUE: CT of the head was performed without the administration of intravenous contrast. Automated exposure control, iterative reconstruction, and/or weight based adjustment of the mA/kV was utilized to reduce the radiation dose to as low as reasonably achievable. COMPARISON: 07/13/2019. CLINICAL HISTORY: Seizure, new-onset, no history of trauma. FINDINGS: BRAIN AND VENTRICLES: No acute hemorrhage. No evidence of acute infarct. Encephalomalacia in bilateral frontal and temporal lobes. Additional encephalomalacia in the left parietal lobe. Patchy white matter hypodensities compatible with chronic microvascular ischemic disease. Cerebral atrophy. Ex vacuo ventricular dilation of the lateral ventricles. No extra-axial collection. No mass effect or midline shift. ORBITS: No acute abnormality. SINUSES: No acute abnormality. SOFT TISSUES AND SKULL: No acute soft tissue abnormality. No skull fracture. Left mastoid effusion. IMPRESSION: 1. No acute intracranial abnormality. 2. Encephalomalacia in the bilateral frontal and temporal lobes and the left parietal lobe, similar to prior. 3.  Chronic microvascular ischemic disease. Electronically signed by: Donnice Mania MD 08/07/2024 12:44 PM EST RP Workstation: [id number]     Procedures   Medications Ordered in the ED  valproate (DEPACON) 1,000 mg in dextrose  5 % 50 mL IVPB (has no administration in time range)  lactated ringers bolus 1,000 mL (1,000 mLs Intravenous Bolus 08/07/24 1102)  metoCLOPramide (REGLAN) injection 10 mg (10 mg Intravenous Given 08/07/24 1429)  diphenhydrAMINE (BENADRYL) injection 12.5 mg (12.5 mg Intravenous Given 08/07/24 1430)  ketorolac (TORADOL) 15 MG/ML injection 15 mg (15 mg Intravenous Given 08/07/24 1430)  LORazepam (ATIVAN) injection 1 mg (1 mg Intravenous Given 08/07/24 1451)                                    Medical Decision Making Amount and/or Complexity of Data Reviewed Labs: ordered. Radiology: ordered.  Risk Prescription drug management.   This patient presents to the ED for concern of seizure-like activity, this involves an extensive number of treatment options, and is a complaint that carries with it a high risk of complications and morbidity.  The differential diagnosis includes seizures, pseudoseizures, tremors, metabolic derangements, polypharmacy, rigors   Co morbidities / Chronic conditions that complicate the patient evaluation  TBI, spastic hemiparesis of left side, PE   Additional history obtained:  Additional history obtained from EMR External records from outside source obtained and reviewed including patient's mother   Lab Tests:  I Ordered, and personally interpreted labs.  The pertinent results include: Normal hemoglobin, no leukocytosis, normal kidney function, normal electrolytes, no evidence of UTI   Imaging Studies ordered:  I ordered imaging studies including CT head, chest x-ray I independently visualized and interpreted imaging which showed no acute findings I agree with the radiologist interpretation   Cardiac Monitoring: / EKG:  The  patient was maintained on a cardiac monitor.  I personally viewed and interpreted the cardiac monitored which showed an underlying rhythm of: Sinus rhythm   Problem List / ED Course / Critical interventions / Medication management  Patient presenting for witnessed seizure-like episodes starting this morning at 4:30 AM.  On arrival in the ED, he is alert and oriented.  He endorses feeling warm but denies any other symptoms.  He  is currently afebrile.  Patient was placed on monitor.  Workup was initiated.  Lab work was unremarkable.  On reassessment, patient resting comfortably.  His mother is now at bedside.  She is able to provide further history.  She states that he was awake during these episodes, each of which lasted for 2 minutes or less.  She does report full body stiffening and shaking.  Patient's workup today is reassuring.  His kidney function and electrolytes are normal.  No leukocytosis is present.  There is no evidence of UTI.  Chest x-ray and CT head did not show any acute findings.  I consulted neurology for recommendations on possibly initiating AEDs.  Given patient's history of TBI, he is at risk for developing epilepsy.  While in the ED, patient endorsed headache and dizziness.  Headache cocktail was ordered.  After receiving Reglan, patient became akathetic.  Dose of Ativan was ordered.  On reassessment, patient's akathisia has improved.  I spoke with neurologist on-call, Dr. Lindzen.  Dr. Lindzen recommends initiating AED dosing of his Depakote .  Patient to receive loading dose of Depacon in the ED followed by 350 mg 3 times daily dosing.  Patient and mother are agreeable to this plan.  Patient's mother reports that he was previously on a higher dose of Depakote  but when he was started on Seroquel , it was reduced due to increased somnolence.  Patient's mother is considering discontinuing his nightly Seroquel , as it does not seem to be helping him at all.  Patient currently does not follow with  a neurologist.  Patient and mother advised to set up follow-up appointment.  Patient was discharged in stable condition. I ordered medication including IV fluids for hydration; Reglan, Toradol, and Benadryl for headache; Ativan for akathisia; Depacon for seizure prophylaxis Reevaluation of the patient after these medicines showed that the patient improved I have reviewed the patients home medicines and have made adjustments as needed   Consultations Obtained:  I requested consultation with the neurologist, Dr. Merrianne,  and discussed lab and imaging findings as well as pertinent plan - they recommend: Increasing Depakote  to 350 mg 3 times daily   Social Determinants of Health:  Lives at home with family      Final diagnoses:  Seizures [facility])    ED Discharge Orders          Ordered    divalproex  (DEPAKOTE ) 125 MG DR tablet  3 times daily        08/07/24 1532               Charles Motto, MD 08/07/24 1539

## 2024-08-07 NOTE — Discharge Instructions (Signed)
 To prevent seizures, increase Depakote  dosing to 375 mg 3 times per day.  With this increase, consider discontinuing or lowering dose of nightly Seroquel .  If recent increase in dantrolene  dosing has not improved symptoms, considering reducing dose of this as well.  Call the telephone number below to set up a follow-up appointment with a neurologist.  Return to the emergency department for any new or worsening symptoms of concern.

## 2024-08-07 NOTE — ED Notes (Signed)
 Pt started getting agitated and hitting his self. MD Dixon notified and ativan ordered.

## 2024-08-07 NOTE — ED Notes (Signed)
 Pt states that his head is hurting and he is feeling dizzy. MD Dixon notified.

## 2024-08-07 NOTE — ED Notes (Signed)
 Patient transported to CT

## 2024-08-07 NOTE — ED Triage Notes (Signed)
 Pt BIB [facility] with complaints of seizure activity. Patient has a history of TBI with left side paralysis. Seizures were noticed by mother at around 0430 this morning. Patient then had two more around 0600 and 0900. The last seizure patient had was when the TBI happened in 2019. Recent increase in dantroline. Pt also had a kidney infection a couple weeks ago and was admitted and on antibiotics. Pt is currently alert and oriented.

## 2024-08-08 LAB — URINE CULTURE: Culture: NO GROWTH

## 2024-08-12 LAB — CULTURE, BLOOD (ROUTINE X 2)
Culture: NO GROWTH
Culture: NO GROWTH
Special Requests: ADEQUATE

## 2024-10-22 ENCOUNTER — Other Ambulatory Visit: Payer: Self-pay

## 2024-10-22 ENCOUNTER — Encounter (HOSPITAL_COMMUNITY): Payer: Self-pay

## 2024-10-22 ENCOUNTER — Emergency Department (HOSPITAL_COMMUNITY): Admission: EM | Admit: 2024-10-22 | Discharge: 2024-10-22 | Disposition: A | Discharge status: Home / Self Care

## 2024-10-22 ENCOUNTER — Emergency Department (HOSPITAL_COMMUNITY)

## 2024-10-22 DIAGNOSIS — R569 Unspecified convulsions: Secondary | ICD-10-CM | POA: Insufficient documentation

## 2024-10-22 DIAGNOSIS — G8384 Todd's paralysis (postepileptic): Secondary | ICD-10-CM | POA: Insufficient documentation

## 2024-10-22 DIAGNOSIS — Z7982 Long term (current) use of aspirin: Secondary | ICD-10-CM | POA: Insufficient documentation

## 2024-10-22 LAB — CBC WITH DIFFERENTIAL/PLATELET
Abs Immature Granulocytes: 0.12 10*3/uL — ABNORMAL HIGH (ref 0.00–0.07)
Basophils Absolute: 0 10*3/uL (ref 0.0–0.1)
Basophils Relative: 0 %
Eosinophils Absolute: 0.1 10*3/uL (ref 0.0–0.5)
Eosinophils Relative: 1 %
HCT: 43.6 % (ref 39.0–52.0)
Hemoglobin: 14.8 g/dL (ref 13.0–17.0)
Immature Granulocytes: 1 %
Lymphocytes Relative: 24 %
Lymphs Abs: 2.5 10*3/uL (ref 0.7–4.0)
MCH: 31 pg (ref 26.0–34.0)
MCHC: 33.9 g/dL (ref 30.0–36.0)
MCV: 91.2 fL (ref 80.0–100.0)
Monocytes Absolute: 1 10*3/uL (ref 0.1–1.0)
Monocytes Relative: 9 %
Neutro Abs: 6.6 10*3/uL (ref 1.7–7.7)
Neutrophils Relative %: 65 %
Platelets: 488 10*3/uL — ABNORMAL HIGH (ref 150–400)
RBC: 4.78 MIL/uL (ref 4.22–5.81)
RDW: 14.3 % (ref 11.5–15.5)
WBC: 10.2 10*3/uL (ref 4.0–10.5)
nRBC: 0 % (ref 0.0–0.2)

## 2024-10-22 LAB — BASIC METABOLIC PANEL WITH GFR
Anion gap: 15 (ref 5–15)
BUN: 13 mg/dL (ref 6–20)
CO2: 23 mmol/L (ref 22–32)
Calcium: 9.2 mg/dL (ref 8.9–10.3)
Chloride: 93 mmol/L — ABNORMAL LOW (ref 98–111)
Creatinine, Ser: 0.72 mg/dL (ref 0.61–1.24)
GFR, Estimated: >60 mL/min
Glucose, Bld: 86 mg/dL (ref 70–99)
Potassium: 3.9 mmol/L (ref 3.5–5.1)
Sodium: 131 mmol/L — ABNORMAL LOW (ref 135–145)

## 2024-10-22 LAB — URINALYSIS, W/ REFLEX TO CULTURE (INFECTION SUSPECTED)
Bilirubin Urine: NEGATIVE
Glucose, UA: NEGATIVE mg/dL
Hgb urine dipstick: NEGATIVE
Ketones, ur: 5 mg/dL — AB
Leukocytes,Ua: NEGATIVE
Nitrite: NEGATIVE
Protein, ur: NEGATIVE mg/dL
Specific Gravity, Urine: 1.017 (ref 1.005–1.030)
pH: 7 (ref 5.0–8.0)

## 2024-10-22 LAB — MAGNESIUM: Magnesium: 2.1 mg/dL (ref 1.7–2.4)

## 2024-10-22 LAB — VALPROIC ACID LEVEL: Valproic Acid Lvl: 48 ug/mL — ABNORMAL LOW (ref 50–100)

## 2024-10-22 MED ORDER — DIVALPROEX SODIUM 250 MG PO DR TAB: 250.0000 mg | DELAYED_RELEASE_TABLET | Freq: Two times a day (BID) | ORAL | 0 refills | Status: AC

## 2024-10-22 MED ORDER — VALPROATE SODIUM 100 MG/ML IV SOLN
1250.0000 mg | Freq: Once | INTRAVENOUS | Status: AC
  Administered 2024-10-22: 1250 mg via INTRAVENOUS
  Filled 2024-10-22: qty 12.5

## 2024-10-22 NOTE — ED Triage Notes (Signed)
 Pt came in from [facility] due to generalized weakness. Mother is caregiiver and stated pt usually scream when touch left disable arm/hand. Pt did not scream when she touched it and stated it is not normal for that to happen. T did have a headache but it is gone at this time. Denies chest pain, SHOB, and N/V

## 2024-10-22 NOTE — Discharge Instructions (Addendum)
 Take your new Depakote  dose as prescribed.  Follow-up with your neurologist next week.

## 2024-10-22 NOTE — Plan of Care (Signed)
 Called from [facility], ER-Dr. Gennaro  Patient with history of TBI and seizures, with breakthrough seizure, now back to baseline. On Depakote -375 mg 3 times a day Level 48 I recommend targeting a level close to 75-80. Load him with Depakote  1250 mg IV x 1 and increase his home dose to Depakote  500 every morning and 750 nightly. Follow-up with outpatient neurology  Please call with questions as needed  Eligio Lav, MD On-call neurologist

## 2024-10-22 NOTE — ED Provider Notes (Signed)
 Pt signed out by Dr. Gennaro at shift change.  Plan is for d/c after Depakote  IV.  Pt is feeling much better.  UA is neg for UTI.  Plan is to increase depakote  at home.  He is stable for d/c.  Return if worse.   Dean Clarity, MD 10/22/24 1626
